# Patient Record
Sex: Female | Born: 1985 | Race: Black or African American | Hispanic: No | Marital: Single | Smoking: Never smoker
Health system: Southern US, Community
[De-identification: ages and names within clinical notes are randomized; demographics above are authoritative.]

## PROBLEM LIST (undated history)

## (undated) DIAGNOSIS — IMO0002 Reserved for concepts with insufficient information to code with codable children: Secondary | ICD-10-CM

## (undated) DIAGNOSIS — Z789 Other specified health status: Secondary | ICD-10-CM

## (undated) DIAGNOSIS — J189 Pneumonia, unspecified organism: Secondary | ICD-10-CM

## (undated) DIAGNOSIS — R87619 Unspecified abnormal cytological findings in specimens from cervix uteri: Secondary | ICD-10-CM

## (undated) HISTORY — DX: Reserved for concepts with insufficient information to code with codable children: IMO0002

## (undated) HISTORY — PX: LEEP: SHX91

## (undated) HISTORY — DX: Unspecified abnormal cytological findings in specimens from cervix uteri: R87.619

---
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
## 2009-05-01 ENCOUNTER — Other Ambulatory Visit: Admission: RE | Admit: 2009-05-01 | Discharge: 2009-05-01 | Payer: Self-pay | Admitting: Unknown Physician Specialty

## 2009-05-01 ENCOUNTER — Encounter (INDEPENDENT_AMBULATORY_CARE_PROVIDER_SITE_OTHER): Payer: Self-pay | Admitting: Unknown Physician Specialty

## 2009-05-07 ENCOUNTER — Emergency Department (HOSPITAL_COMMUNITY): Admission: EM | Admit: 2009-05-07 | Discharge: 2009-05-08 | Payer: Self-pay | Admitting: Emergency Medicine

## 2009-12-11 ENCOUNTER — Other Ambulatory Visit: Admission: RE | Admit: 2009-12-11 | Discharge: 2009-12-11 | Payer: Self-pay | Admitting: Unknown Physician Specialty

## 2010-07-21 NOTE — L&D Delivery Note (Addendum)
Delivery Note At 9:14 AM a viable female was delivered via Vaginal, Spontaneous Delivery (Presentation: Left Occiput Anterior).  APGAR: 4, 7; weight 7 lb 10.6 oz (3475 g).   Placenta status: Intact, Spontaneous.  Cord: 3 vessels with the following complications: 1 minute 15 second shoulder dystocia.  Delivery of the head occurred at 9:13am.  During the first 30 seconds, downward traction was preformed with McRoberts and suprapubic pressure, without success.  A shoulder dystocia code was called.  At 9:13:30, suprapubic pressure was continued as I attempted to feel for the posterior shoulder.  Despite multiple attempts, I was unable to reduce the posterior shoulder.  Dr Marice Potter arrived at 9:14:10 and resolved the shoulder dystocia by 9:14:15.      Anesthesia: Epidural  Episiotomy: none Lacerations: 2nd degree;Perineal Suture Repair: 3.0 vicryl Est. Blood Loss (mL): 500  Mom to postpartum.  Baby to NICU.  Elizabeth Horton JEHIEL 06/19/2011, 9:52 AM

## 2010-10-24 LAB — URINALYSIS, ROUTINE W REFLEX MICROSCOPIC
Bilirubin Urine: NEGATIVE
Glucose, UA: NEGATIVE mg/dL
Hgb urine dipstick: NEGATIVE
Ketones, ur: NEGATIVE mg/dL
Nitrite: NEGATIVE
Protein, ur: NEGATIVE mg/dL
Specific Gravity, Urine: <1.005 — ABNORMAL LOW (ref 1.005–1.030)
Urobilinogen, UA: 0.2 mg/dL (ref 0.0–1.0)
pH: 6.5 (ref 5.0–8.0)

## 2010-10-24 LAB — URINE MICROSCOPIC-ADD ON

## 2010-10-24 LAB — PREGNANCY, URINE: Preg Test, Ur: NEGATIVE

## 2010-11-14 LAB — HEPATITIS B SURFACE ANTIGEN: Hepatitis B Surface Ag: NEGATIVE

## 2010-11-14 LAB — HIV ANTIBODY (ROUTINE TESTING W REFLEX): HIV: NONREACTIVE

## 2010-11-14 LAB — RPR: RPR: NONREACTIVE

## 2010-11-14 LAB — RUBELLA ANTIBODY, IGM: Rubella: IMMUNE

## 2010-11-14 LAB — ABO/RH: RH Type: POSITIVE

## 2011-05-27 LAB — STREP B DNA PROBE: GBS: NEGATIVE

## 2011-06-18 ENCOUNTER — Inpatient Hospital Stay (HOSPITAL_COMMUNITY)
Admission: AD | Admit: 2011-06-18 | Discharge: 2011-06-21 | DRG: 774 | Disposition: A | Discharge status: Home / Self Care | Payer: Medicaid Other | Source: Ambulatory Visit | Attending: Family Medicine | Admitting: Family Medicine

## 2011-06-18 ENCOUNTER — Inpatient Hospital Stay (HOSPITAL_COMMUNITY): Payer: Medicaid Other | Admitting: Anesthesiology

## 2011-06-18 ENCOUNTER — Encounter (HOSPITAL_COMMUNITY): Payer: Self-pay | Admitting: Anesthesiology

## 2011-06-18 ENCOUNTER — Encounter (HOSPITAL_COMMUNITY): Payer: Self-pay | Admitting: *Deleted

## 2011-06-18 DIAGNOSIS — IMO0001 Reserved for inherently not codable concepts without codable children: Secondary | ICD-10-CM

## 2011-06-18 DIAGNOSIS — O411090 Infection of amniotic sac and membranes, unspecified, unspecified trimester, not applicable or unspecified: Secondary | ICD-10-CM | POA: Diagnosis present

## 2011-06-18 DIAGNOSIS — Z3490 Encounter for supervision of normal pregnancy, unspecified, unspecified trimester: Secondary | ICD-10-CM

## 2011-06-18 HISTORY — DX: Other specified health status: Z78.9

## 2011-06-18 LAB — CBC
HCT: 34.4 % — ABNORMAL LOW (ref 36.0–46.0)
Hemoglobin: 11.4 g/dL — ABNORMAL LOW (ref 12.0–15.0)
MCH: 26.6 pg (ref 26.0–34.0)
MCHC: 33.1 g/dL (ref 30.0–36.0)
MCV: 80.4 fL (ref 78.0–100.0)
Platelets: 260 10*3/uL (ref 150–400)
RBC: 4.28 MIL/uL (ref 3.87–5.11)
RDW: 17.1 % — ABNORMAL HIGH (ref 11.5–15.5)
WBC: 10.4 10*3/uL (ref 4.0–10.5)

## 2011-06-18 LAB — RPR: RPR Ser Ql: NONREACTIVE

## 2011-06-18 MED ORDER — OXYCODONE-ACETAMINOPHEN 5-325 MG PO TABS: 2.0000 | ORAL_TABLET | ORAL | Status: DC | PRN

## 2011-06-18 MED ORDER — PHENYLEPHRINE 40 MCG/ML (10ML) SYRINGE FOR IV PUSH (FOR BLOOD PRESSURE SUPPORT): 80.0000 ug | PREFILLED_SYRINGE | INTRAVENOUS | Status: DC | PRN

## 2011-06-18 MED ORDER — OXYTOCIN BOLUS FROM INFUSION
500.0000 mL | Freq: Once | INTRAVENOUS | Status: DC
  Filled 2011-06-18: qty 500

## 2011-06-18 MED ORDER — ACETAMINOPHEN 325 MG PO TABS
650.0000 mg | ORAL_TABLET | ORAL | Status: DC | PRN
  Administered 2011-06-18 – 2011-06-19 (×3): 650 mg via ORAL
  Filled 2011-06-18: qty 1
  Filled 2011-06-18 (×3): qty 2

## 2011-06-18 MED ORDER — EPHEDRINE 5 MG/ML INJ
10.0000 mg | INTRAVENOUS | Status: DC | PRN
  Filled 2011-06-18: qty 4

## 2011-06-18 MED ORDER — IBUPROFEN 600 MG PO TABS: 600.0000 mg | ORAL_TABLET | Freq: Four times a day (QID) | ORAL | Status: DC | PRN

## 2011-06-18 MED ORDER — EPHEDRINE 5 MG/ML INJ: 10.0000 mg | INTRAVENOUS | Status: DC | PRN

## 2011-06-18 MED ORDER — ONDANSETRON HCL 4 MG/2ML IJ SOLN: 4.0000 mg | Freq: Four times a day (QID) | INTRAMUSCULAR | Status: DC | PRN

## 2011-06-18 MED ORDER — SODIUM CHLORIDE 0.9 % IV SOLN
2.0000 g | Freq: Four times a day (QID) | INTRAVENOUS | Status: DC
  Administered 2011-06-18 – 2011-06-20 (×5): 2 g via INTRAVENOUS
  Filled 2011-06-18 (×8): qty 2000

## 2011-06-18 MED ORDER — CITRIC ACID-SODIUM CITRATE 334-500 MG/5ML PO SOLN
30.0000 mL | ORAL | Status: DC | PRN
  Filled 2011-06-18: qty 15

## 2011-06-18 MED ORDER — LIDOCAINE HCL 1.5 % IJ SOLN
INTRAMUSCULAR | Status: DC | PRN
  Administered 2011-06-18 (×2): 5 mL via EPIDURAL

## 2011-06-18 MED ORDER — DIPHENHYDRAMINE HCL 50 MG/ML IJ SOLN
12.5000 mg | INTRAMUSCULAR | Status: DC | PRN
  Administered 2011-06-18: 12.5 mg via INTRAVENOUS
  Filled 2011-06-18: qty 1

## 2011-06-18 MED ORDER — FENTANYL 2.5 MCG/ML BUPIVACAINE 1/10 % EPIDURAL INFUSION (WH - ANES)
INTRAMUSCULAR | Status: DC | PRN
  Administered 2011-06-18: 14 mL/h via EPIDURAL

## 2011-06-18 MED ORDER — PHENYLEPHRINE 40 MCG/ML (10ML) SYRINGE FOR IV PUSH (FOR BLOOD PRESSURE SUPPORT)
80.0000 ug | PREFILLED_SYRINGE | INTRAVENOUS | Status: DC | PRN
  Filled 2011-06-18: qty 5

## 2011-06-18 MED ORDER — GENTAMICIN SULFATE 40 MG/ML IJ SOLN
170.0000 mg | Freq: Three times a day (TID) | INTRAVENOUS | Status: DC
  Administered 2011-06-18 – 2011-06-19 (×3): 170 mg via INTRAVENOUS
  Filled 2011-06-18 (×6): qty 4.25

## 2011-06-18 MED ORDER — LACTATED RINGERS IV SOLN
500.0000 mL | INTRAVENOUS | Status: DC | PRN
  Administered 2011-06-18: 500 mL via INTRAVENOUS

## 2011-06-18 MED ORDER — LACTATED RINGERS IV SOLN
INTRAVENOUS | Status: DC
  Administered 2011-06-18 – 2011-06-19 (×4): via INTRAVENOUS

## 2011-06-18 MED ORDER — LACTATED RINGERS IV SOLN: 500.0000 mL | Freq: Once | INTRAVENOUS | Status: DC

## 2011-06-18 MED ORDER — LIDOCAINE HCL (PF) 1 % IJ SOLN
30.0000 mL | INTRAMUSCULAR | Status: DC | PRN
  Filled 2011-06-18: qty 30

## 2011-06-18 MED ORDER — FENTANYL 2.5 MCG/ML BUPIVACAINE 1/10 % EPIDURAL INFUSION (WH - ANES)
14.0000 mL/h | INTRAMUSCULAR | Status: DC
  Administered 2011-06-18 – 2011-06-19 (×5): 14 mL/h via EPIDURAL
  Filled 2011-06-18 (×6): qty 60

## 2011-06-18 MED ORDER — OXYTOCIN 20 UNITS IN LACTATED RINGERS INFUSION - SIMPLE
125.0000 mL/h | Freq: Once | INTRAVENOUS | Status: DC
  Filled 2011-06-18: qty 1000

## 2011-06-18 MED ORDER — FLEET ENEMA 7-19 GM/118ML RE ENEM: 1.0000 | ENEMA | RECTAL | Status: DC | PRN

## 2011-06-18 MED ORDER — ONDANSETRON 8 MG PO TBDP
8.0000 mg | ORAL_TABLET | Freq: Once | ORAL | Status: AC
  Administered 2011-06-18: 8 mg via ORAL
  Filled 2011-06-18: qty 1

## 2011-06-18 NOTE — H&P (Signed)
Elizabeth Horton is a 25 y.o. female presenting for SOL  Maternal Medical History:  Reason for admission: Reason for admission: contractions and nausea.  Contractions: Onset was 13-24 hours ago.   Frequency: regular.   Duration is approximately 3 minutes.   Perceived severity is strong.    Fetal activity: Perceived fetal activity is normal.   Last perceived fetal movement was within the past hour.    Prenatal complications: no prenatal complications Regular PNC at [facility]. Plans circ, breast, OCPs    OB History    Grav Para Term Preterm Abortions TAB SAB Ect Mult Living   1              Past Medical History  Diagnosis Date  . No pertinent past medical history    Past Surgical History  Procedure Date  . No past surgeries    Family History: family history is not on file.M- HTN, F-colon Ca, MGM-DM Social History:  reports that she has never smoked. She does not have any smokeless tobacco history on file. She reports that she does not drink alcohol or use illicit drugs.Lives with her mother, works [employer]  Review of Systems  Constitutional: Negative.  Negative for fever and chills.  Eyes: Negative for blurred vision.  Respiratory: Negative.   Gastrointestinal: Positive for nausea and vomiting. Negative for constipation.  Genitourinary: Negative.   Skin: Negative for rash.  Neurological: Negative.  Negative for headaches.  Psychiatric/Behavioral: Negative.     Dilation: 4 Effacement (%): 80 Station: -2 Exam by:: D. Leith Szafranski CNM Blood pressure 112/69, pulse 119, temperature 97.7 F (36.5 C), temperature source Oral, resp. rate 22, height 5\' 3"  (1.6 m), weight 87.816 kg (193 lb 9.6 oz). Maternal Exam:  Uterine Assessment: Contraction strength is moderate.  Contraction duration is 1 minute. Contraction frequency is regular.   Abdomen: Fundal height is term size.   Estimated fetal weight is 7#.   Fetal presentation: vertex  Introitus: Normal vulva. Normal vagina.  Vagina is  negative for discharge.    Fetal Exam Fetal Monitor Review: Mode: ultrasound.   Baseline rate: 130.  Variability: moderate (6-25 bpm).   Pattern: accelerations present and no decelerations.    Fetal State Assessment: Category I - tracings are normal.     Physical Exam  Constitutional: She is oriented to person, place, and time. She appears well-developed and well-nourished. No distress.  HENT:  Head: Normocephalic.  Neck: Neck supple.  Cardiovascular: Normal rate, regular rhythm and normal heart sounds.   Respiratory: Effort normal and breath sounds normal.  GI: Soft. There is no tenderness.  Genitourinary: Vagina normal. No vaginal discharge found.  Neurological: She is alert and oriented to person, place, and time. She has normal reflexes.  Skin: Skin is warm and dry.  Psychiatric: She has a normal mood and affect. Her behavior is normal.    Prenatal labs: ABO, Rh:  B+ Antibody:  neg Rubella:  Im RPR:   NR HBsAg:   neg HIV:   NR GBS:   neg 2hr GTT 77-110-100   Assessment/Plan: 25yo G1 at [redacted]w[redacted]d BEGA by 1st tri [place] in early active phase of labor with reassuring FHR Admit     Bowman Higbie 06/18/2011, 10:08 AM

## 2011-06-18 NOTE — ED Provider Notes (Signed)
25 yo G1 at 39.5 wks with SOL and reassuring FHR. Cx 4/80/-2 See Admission H&P Elizabeth Horton 06/18/2011 10:00 AM

## 2011-06-18 NOTE — Progress Notes (Signed)
uc's since last night, becoming more intense, denies bleeding or LOF.  SVE on Monday @ MD office, 2/50.

## 2011-06-18 NOTE — Anesthesia Procedure Notes (Signed)
Epidural Patient location during procedure: OB Start time: 06/18/2011 11:59 AM End time: 06/18/2011 12:06 PM Reason for block: procedure for pain  Staffing Anesthesiologist: Sandrea Hughs Performed by: anesthesiologist   Preanesthetic Checklist Completed: patient identified, site marked, surgical consent, pre-op evaluation, timeout performed, IV checked, risks and benefits discussed and monitors and equipment checked  Epidural Patient position: sitting Prep: site prepped and draped and DuraPrep Patient monitoring: continuous pulse ox and blood pressure Approach: midline Injection technique: LOR air  Needle:  Needle type: Tuohy  Needle gauge: 17 G Needle length: 9 cm Needle insertion depth: 6 cm Catheter type: closed end flexible Catheter size: 19 Gauge Catheter at skin depth: 11 cm Test dose: negative and 1.5% lidocaine  Assessment Sensory level: T8 Events: blood not aspirated, injection not painful, no injection resistance, negative IV test and no paresthesia

## 2011-06-18 NOTE — Progress Notes (Signed)
Elizabeth Horton is a 25 y.o. G1P0000 at [redacted]w[redacted]d  admitted for active labor  Subjective: Patient continues to feel some pressure and it is slightly increasing. No distress.   Objective: BP 106/60  Pulse 102  Temp(Src) 99.7 F (37.6 C) (Oral)  Resp 20  Ht 5\' 3"  (1.6 m)  Wt 87.816 kg (193 lb 9.6 oz)  BMI 34.29 kg/m2  SpO2 96%      FHT:  FHR: 150 bpm, variability: moderate,  accelerations:  Present,  decelerations:  Present patient noted to have 1 late decel at 19:54 after a change in baseline from 150 to 140 over several minutes. Baseline returned to 150 and accels seen. Patient then had 2 late decels from 20:16. Patient placed on o2 and repositioned. Patient had had some diminished variability during that time closer to 5-10 range which has now improved to >10 consistently.   UC:   regular, every 3-4 minutes SVE:   Dilation: 9 Effacement (%): 100 Station: -2 Exam by:: Jarome Lamas RNC  Labs: Lab Results  Component Value Date   WBC 10.4 06/18/2011   HGB 11.4* 06/18/2011   HCT 34.4* 06/18/2011   MCV 80.4 06/18/2011   PLT 260 06/18/2011    Assessment / Plan: Spontaneous labor, progressing normally Will recheck cervix within next half hour.   Labor: Progressing normally Fetal Wellbeing:  Category II Pain Control:  Epidural I/D:  n/a Anticipated MOD:  NSVD  [name] 06/18/2011, 8:36 PM

## 2011-06-18 NOTE — Progress Notes (Signed)
Pt reports she had fever of 100.6 last night, took 2 tylenol, afebrile today.

## 2011-06-18 NOTE — Progress Notes (Signed)
Alissia Lory Abdul-Rashid is a 25 y.o. G1P0000 at [redacted]w[redacted]d  admitted for SOL.   Subjective: Pt is resting comfortably.  Family at bedside. Feeling some mild pressure on right side during contractions.   Objective: BP 94/58  Pulse 107  Temp(Src) 98.1 F (36.7 C) (Oral)  Resp 20  Ht 5\' 3"  (1.6 m)  Wt 87.816 kg (193 lb 9.6 oz)  BMI 34.29 kg/m2  SpO2 96%      FHT:  FHR: 130 bpm, variability: moderate,  accelerations:  Abscent,  decelerations:  Absent UC:   irregular, every 3-5 minutes SVE:   Dilation: 6 Effacement (%): 100 Station: -2 Exam by:: Erline Hau RNC  Labs: Lab Results  Component Value Date   WBC 10.4 06/18/2011   HGB 11.4* 06/18/2011   HCT 34.4* 06/18/2011   MCV 80.4 06/18/2011   PLT 260 06/18/2011    Assessment / Plan: Augmentation of labor, progressing well   Fetal Wellbeing:  Category I Pain Control:  Epidural  Anticipated MOD:  NSVD  O'Grady, Margherita Collyer 06/18/2011, 1:28 PM

## 2011-06-18 NOTE — Progress Notes (Signed)
ANTIBIOTIC CONSULT NOTE - INITIAL  Pharmacy Consult for Gentamicin Indication: Chorioamnionitis  No Known Allergies  Patient Measurements: Height: 5\' 3"  (160 cm) Weight: 193 lb 9.6 oz (87.816 kg) IBW/kg (Calculated) : 52.4  Adjusted Body Weight: 63kg  Vital Signs: Temp: 100.9 F (38.3 C) (11/28 2323) Temp src: Oral (11/28 2323) BP: 115/60 mmHg (11/28 2302) Pulse Rate: 112  (11/28 2302)  Labs:  Basename 06/18/11 1010  WBC 10.4  HGB 11.4*  PLT 260  LABCREA --  CREATININE --   Estimated Scr=0.7 with estimated CrCl > 146ml/min.  Medical History: Past Medical History  Diagnosis Date  . No pertinent past medical history     Medications:  Ampicillin 2 gram IV q6h Assessment: 25yo admitted in labor with maternal temp > 100F.  Goal of Therapy:  Gentamicin peaks 6-50mcg/ml and trough < 1 mcg/ml.  Plan:  1. Gentamicin 170mg  IV q8h. 2. Draw Screatinine if continued postpartum. 3. Will continue to follow and check levels as clinically indicated.  Thanks!  Claybon Jabs 06/18/2011,11:24 PM

## 2011-06-18 NOTE — H&P (Signed)
Chart reviewed and agree with management and plan.  

## 2011-06-18 NOTE — Progress Notes (Signed)
Elizabeth Horton is a 25 y.o. G1P0000 at [redacted]w[redacted]d by admitted for active labor.   Subjective: Pt is feeling comfortable.  Family at bedside. Pt had some itching, and so rcvd benadryl.   Objective: BP 120/64  Pulse 103  Temp(Src) 98.2 F (36.8 C) (Oral)  Resp 20  Ht 5\' 3"  (1.6 m)  Wt 87.816 kg (193 lb 9.6 oz)  BMI 34.29 kg/m2  SpO2 96%      FHT:  FHR: 135 bpm, variability: moderate,  accelerations:  Present,  decelerations:  Absent UC:   regular, every 4 minutes SVE:   Dilation 8  Effacement 100  Station: -2  Labs: Lab Results  Component Value Date   WBC 10.4 06/18/2011   HGB 11.4* 06/18/2011   HCT 34.4* 06/18/2011   MCV 80.4 06/18/2011   PLT 260 06/18/2011    Assessment / Plan: Augmentation of labor, progressing well  Labor: Progressing normally on pit.  Will cont with expectant management.   Fetal Wellbeing:  Category I Pain Control:  Epidural Anticipated MOD:  NSVD  O'Grady, Tee Richeson 06/18/2011, 4:34 PM

## 2011-06-18 NOTE — Anesthesia Preprocedure Evaluation (Signed)
Anesthesia Evaluation  Patient identified by MRN, date of birth, ID band Patient awake    Reviewed: Allergy & Precautions, H&P , Patient's Chart, lab work & pertinent test results  Airway Mallampati: I TM Distance: >3 FB Neck ROM: full    Dental No notable dental hx.    Pulmonary neg pulmonary ROS,    Pulmonary exam normal       Cardiovascular neg cardio ROS     Neuro/Psych Negative Neurological ROS  Negative Psych ROS   GI/Hepatic negative GI ROS, Neg liver ROS,   Endo/Other  Negative Endocrine ROS  Renal/GU negative Renal ROS  Genitourinary negative   Musculoskeletal negative musculoskeletal ROS (+)   Abdominal Normal abdominal exam  (+)   Peds negative pediatric ROS (+)  Hematology negative hematology ROS (+)   Anesthesia Other Findings   Reproductive/Obstetrics (+) Pregnancy                           Anesthesia Physical Anesthesia Plan  ASA: II  Anesthesia Plan: Epidural   Post-op Pain Management:    Induction:   Airway Management Planned:   Additional Equipment:   Intra-op Plan:   Post-operative Plan:   Informed Consent: I have reviewed the patients History and Physical, chart, labs and discussed the procedure including the risks, benefits and alternatives for the proposed anesthesia with the patient or authorized representative who has indicated his/her understanding and acceptance.     Plan Discussed with:   Anesthesia Plan Comments:         Anesthesia Quick Evaluation  

## 2011-06-18 NOTE — Progress Notes (Signed)
Elizabeth Horton is a 25 y.o. G1P0000 at [redacted]w[redacted]d  admitted for SOL.   Subjective:   Objective: BP 97/79  Pulse 117  Temp(Src) 98.2 F (36.8 C) (Oral)  Resp 20  Ht 5\' 3"  (1.6 m)  Wt 87.816 kg (193 lb 9.6 oz)  BMI 34.29 kg/m2  SpO2 96%      FHT:  FHR: 130 bpm, variability: moderate,  accelerations:  Present,  decelerations:  Absent UC:   2-3 ctx in 10 minutes SVE:   Dilation: 7 Effacement (%): 100 Station: -2 Exam by:: Erline Hau RNC  Labs: Lab Results  Component Value Date   WBC 10.4 06/18/2011   HGB 11.4* 06/18/2011   HCT 34.4* 06/18/2011   MCV 80.4 06/18/2011   PLT 260 06/18/2011    Assessment / Plan: Augmentation of labor, progressing well  Labor: Progressing on Pitocin, will continue to increase then AROM Fetal Wellbeing:  Category I Pain Control:  Epidural  Anticipated MOD:  NSVD  O'Grady, Riko Lumsden 06/18/2011, 3:25 PM

## 2011-06-18 NOTE — Progress Notes (Signed)
   Elizabeth Horton is a 25 y.o. G1P0000 at [redacted]w[redacted]d  admitted for active labor  Subjective:  cmfortable with epidural Objective: BP 102/59  Pulse 86  Temp(Src) 99.7 F (37.6 C) (Oral)  Resp 20  Ht 5\' 3"  (1.6 m)  Wt 87.816 kg (193 lb 9.6 oz)  BMI 34.29 kg/m2  SpO2 96%    FHT:  140's, avg LTV, + accels, no decels UC:   regular, every 3 minutes SVE:   Dilation: 9 Effacement (%): 100 Station: -2 Exam by:: [employer] RNC  Labs: Lab Results  Component Value Date   WBC 10.4 06/18/2011   HGB 11.4* 06/18/2011   HCT 34.4* 06/18/2011   MCV 80.4 06/18/2011   PLT 260 06/18/2011    Assessment / Plan: Spontaneous labor, progressing normally  Labor: Progressing normally Fetal Wellbeing:  Category I Pain Control:  Epidural Anticipated MOD:  NSVD  CRESENZO-DISHMAN,Shanayah Kaffenberger 06/18/2011, 8:19 PM

## 2011-06-18 NOTE — ED Provider Notes (Signed)
Chart reviewed and agree with management and plan.  

## 2011-06-19 ENCOUNTER — Encounter (HOSPITAL_COMMUNITY): Payer: Self-pay | Admitting: *Deleted

## 2011-06-19 DIAGNOSIS — O411090 Infection of amniotic sac and membranes, unspecified, unspecified trimester, not applicable or unspecified: Secondary | ICD-10-CM

## 2011-06-19 MED ORDER — OXYCODONE-ACETAMINOPHEN 5-325 MG PO TABS: 1.0000 | ORAL_TABLET | ORAL | Status: DC | PRN

## 2011-06-19 MED ORDER — PRENATAL PLUS 27-1 MG PO TABS
1.0000 | ORAL_TABLET | Freq: Every day | ORAL | Status: DC
  Administered 2011-06-20 – 2011-06-21 (×2): 1 via ORAL
  Filled 2011-06-19 (×2): qty 1

## 2011-06-19 MED ORDER — DIPHENHYDRAMINE HCL 25 MG PO CAPS: 25.0000 mg | ORAL_CAPSULE | Freq: Four times a day (QID) | ORAL | Status: DC | PRN

## 2011-06-19 MED ORDER — OXYTOCIN 20 UNITS IN LACTATED RINGERS INFUSION - SIMPLE
1.0000 m[IU]/min | INTRAVENOUS | Status: DC
  Administered 2011-06-19: 2 m[IU]/min via INTRAVENOUS

## 2011-06-19 MED ORDER — ONDANSETRON HCL 4 MG PO TABS: 4.0000 mg | ORAL_TABLET | ORAL | Status: DC | PRN

## 2011-06-19 MED ORDER — TERBUTALINE SULFATE 1 MG/ML IJ SOLN: 0.2500 mg | Freq: Once | INTRAMUSCULAR | Status: DC | PRN

## 2011-06-19 MED ORDER — SIMETHICONE 80 MG PO CHEW: 80.0000 mg | CHEWABLE_TABLET | ORAL | Status: DC | PRN

## 2011-06-19 MED ORDER — DIBUCAINE 1 % RE OINT: 1.0000 "application " | TOPICAL_OINTMENT | RECTAL | Status: DC | PRN

## 2011-06-19 MED ORDER — ONDANSETRON HCL 4 MG/2ML IJ SOLN: 4.0000 mg | INTRAMUSCULAR | Status: DC | PRN

## 2011-06-19 MED ORDER — BENZOCAINE-MENTHOL 20-0.5 % EX AERO
1.0000 "application " | INHALATION_SPRAY | CUTANEOUS | Status: DC | PRN
  Administered 2011-06-19: 1 via TOPICAL

## 2011-06-19 MED ORDER — ZOLPIDEM TARTRATE 5 MG PO TABS
5.0000 mg | ORAL_TABLET | Freq: Every evening | ORAL | Status: DC | PRN
  Administered 2011-06-20: 5 mg via ORAL
  Filled 2011-06-19: qty 1

## 2011-06-19 MED ORDER — BENZOCAINE-MENTHOL 20-0.5 % EX AERO
INHALATION_SPRAY | CUTANEOUS | Status: AC
  Filled 2011-06-19: qty 56

## 2011-06-19 MED ORDER — SENNOSIDES-DOCUSATE SODIUM 8.6-50 MG PO TABS
2.0000 | ORAL_TABLET | Freq: Every day | ORAL | Status: DC
  Administered 2011-06-19 – 2011-06-20 (×2): 2 via ORAL

## 2011-06-19 MED ORDER — LANOLIN HYDROUS EX OINT: TOPICAL_OINTMENT | CUTANEOUS | Status: DC | PRN

## 2011-06-19 MED ORDER — WITCH HAZEL-GLYCERIN EX PADS: 1.0000 "application " | MEDICATED_PAD | CUTANEOUS | Status: DC | PRN

## 2011-06-19 MED ORDER — IBUPROFEN 600 MG PO TABS
600.0000 mg | ORAL_TABLET | Freq: Four times a day (QID) | ORAL | Status: DC
  Administered 2011-06-19 – 2011-06-21 (×9): 600 mg via ORAL
  Filled 2011-06-19 (×9): qty 1

## 2011-06-19 MED ORDER — TETANUS-DIPHTH-ACELL PERTUSSIS 5-2.5-18.5 LF-MCG/0.5 IM SUSP
0.5000 mL | Freq: Once | INTRAMUSCULAR | Status: AC
  Administered 2011-06-21: 0.5 mL via INTRAMUSCULAR
  Filled 2011-06-19: qty 0.5

## 2011-06-19 NOTE — Progress Notes (Signed)
UR Chart review completed.  

## 2011-06-19 NOTE — Anesthesia Postprocedure Evaluation (Signed)
  Anesthesia Post-op Note  Patient: Elizabeth Horton  Procedure(s) Performed: * No procedures listed *  Patient Location: [facility] Unit  Anesthesia Type: Epidural  Level of Consciousness: awake  Airway and Oxygen Therapy: Patient Spontanous Breathing  Post-op Pain: mild  Post-op Assessment: Post-op Vital signs reviewed  Post-op Vital Signs: Reviewed and stable  Complications: No apparent anesthesia complications

## 2011-06-19 NOTE — Addendum Note (Signed)
Addendum  created 06/19/11 1636 by Cephus Shelling   Modules edited:Charges VN, Notes Section

## 2011-06-19 NOTE — Progress Notes (Signed)
   Elizabeth Horton is a 25 y.o. G1P0000 at [redacted]w[redacted]d  admitted for active labor  Subjective:  Comfortable with epidural.  Motivated for vaginal delivery if possible Objective: BP 104/61  Pulse 94  Temp(Src) 100.3 F (37.9 C) (Axillary)  Resp 20  Ht 5\' 3"  (1.6 m)  Wt 87.816 kg (193 lb 9.6 oz)  BMI 34.29 kg/m2  SpO2 100%    FHT:  FHR: 150 bpm, variability: moderate,  accelerations:  Present,  decelerations:  Present late UC:   regular, every 2 minutes SVE:   Dilation: Lip/rim Effacement (%): 100 Station: -1 Exam by:: F Cresenzo-Dishmon CNM Contractions have been adequate (180-229mmHg) for the past hour.  Late decels for the past 20 minutes.  Vtx down to 0 station with contractions.  Pelvis feels adequate.  Dr. Despina Hidden consulted.  Discussed options with pt.  Will cut pitocin off, allow baby intrauterine resuscitation, and try pushing.  Labs: Lab Results  Component Value Date   WBC 10.4 06/18/2011   HGB 11.4* 06/18/2011   HCT 34.4* 06/18/2011   MCV 80.4 06/18/2011   PLT 260 06/18/2011    Assessment / Plan: Protracted active phase  Labor: adequate Fetal Wellbeing:  Category II Pain Control:  Epidural Anticipated MOD:  NSVD  CRESENZO-DISHMAN,Chisa Kushner 06/19/2011, 5:30 AM

## 2011-06-19 NOTE — Progress Notes (Signed)
Zenovia Justman Abdul-Rashid is a 25 y.o. G1P0000 at [redacted]w[redacted]d admitted for active labor  Subjective: Can feel pressure during contractions  Objective: BP 117/70  Pulse 90  Temp(Src) 99.7 F (37.6 C) (Axillary)  Resp 20  Ht 5\' 3"  (1.6 m)  Wt 87.816 kg (193 lb 9.6 oz)  BMI 34.29 kg/m2  SpO2 96%      FHT:  FHR: 150 bpm, variability: moderate,  accelerations:  Present,  decelerations:  Absent UC:   regular, every 4 minutes SVE:   Dilation: Lip/rim Effacement (%): 100 Station: -1 Exam by:: Larose Kells RN  Labs: Lab Results  Component Value Date   WBC 10.4 06/18/2011   HGB 11.4* 06/18/2011   HCT 34.4* 06/18/2011   MCV 80.4 06/18/2011   PLT 260 06/18/2011    Assessment / Plan: Spontaneous labor Patient did not progress with her cervix in 2 hours. Placed IUPC to monitor for adequate contractions. WIll start pitocin if not adequate. Otherwise, will give patient approximately 3 hours with 1 hour checks before discussions of possible C-section. FHT reassuring-will continue to monitor.   Labor: see plan Fetal Wellbeing:  Category I Pain Control:  Epidural I/D:  ampicillin and gentamicin for chorioamnionitis Anticipated MOD:  NSVD  Palin Tristan 06/19/2011, 12:57 AM

## 2011-06-19 NOTE — Anesthesia Postprocedure Evaluation (Signed)
Anesthesia Post Note  Patient: Elizabeth Horton  Procedure(s) Performed: * No procedures listed *  Anesthesia type: Epidural  Patient location: Mother/Baby  Post pain: Pain level controlled  Post assessment: Post-op Vital signs reviewed  Last Vitals:  Filed Vitals:   06/19/11 1017  BP: 116/70  Pulse: 88  Temp:   Resp:     Post vital signs: Reviewed  Level of consciousness: awake  Complications: No apparent anesthesia complications

## 2011-06-19 NOTE — Progress Notes (Signed)
   Elizabeth Horton is a 25 y.o. G1P0000 at [redacted]w[redacted]d  admitted for active labor  Subjective:   Objective: BP 115/67  Pulse 96  Temp(Src) 100.9 F (38.3 C) (Oral)  Resp 20  Ht 5\' 3"  (1.6 m)  Wt 87.816 kg (193 lb 9.6 oz)  BMI 34.29 kg/m2  SpO2 96%    FHT:  FHR: 160. bpm, variability: moderate,  accelerations:  Present,  decelerations:  Absent UC:   regular, every 2-3 minutes SVE:   Dilation: Lip/rim Effacement (%): 100 Station: -1 Exam by:: Elizabeth Kells RN  Labs: Lab Results  Component Value Date   WBC 10.4 06/18/2011   HGB 11.4* 06/18/2011   HCT 34.4* 06/18/2011   MCV 80.4 06/18/2011   PLT 260 06/18/2011    Assessment / Plan: Augmentation of labor, progressing well; chorioamnionitis  Labor: Progressing normally Fetal Wellbeing:  Category II Pain Control:  Epidural Anticipated MOD:  NSVD  Elizabeth Horton 06/19/2011, 12:05 AM

## 2011-06-19 NOTE — Addendum Note (Signed)
Addendum  created 06/19/11 1636 by Marshell Dilauro   Modules edited:Charges VN, Notes Section    

## 2011-06-19 NOTE — Progress Notes (Signed)
Patient ID: Elizabeth Horton, female   DOB: [birth year], 25 y.o.   MRN: [id number]   I was heading towards the room of Elizabeth Horton when I saw the emergency light turn on.  I immediately ran into the room and was informed by Dr. Adrian Blackwater that he needed my assistance with delivering the baby.  Suprapubic pressure was being given and she was already in McRobert's position.  I immediately did a corkscrew maneuver and the baby rotated from OA to the LOA position and was delivered within 10 seconds. Dr. Adrian Blackwater then resumed her care and delivered the placenta.

## 2011-06-19 NOTE — Progress Notes (Signed)
Patient ID: Elizabeth Horton, female   DOB: Jul 27, 1985, 25 y.o.   MRN: [id number] Patient reassessed.  Since 0500 the heart rate has benn reassuring with positive scalp stim and the disappearance immediately of late decelerations when the pitocin was turned off and the contractions were less frequent.  Patient began pushing about 1 hour ago.  Molding now down to +3 station, feels as if the baby will fit through.  Patient pushing well.  Again positive scalp stim, no concern at this point for any fetal compromise.  Being treated for chorioamnionitis but metabolically I think baby is stable.  Female [name] 06/19/2011 7:23 AM

## 2011-06-19 NOTE — Progress Notes (Signed)
Elizabeth Horton is a 25 y.o. G1P0000 at 21w6dadmitted for active labor  Subjective: Attempting to rest. Still feeling pressure during contractions but pain controlled  Objective: BP 111/59  Pulse 93  Temp(Src) 100.9 F (38.3 C) (Axillary)  Resp 20  Ht 5\' 3"  (1.6 m)  Wt 87.816 kg (193 lb 9.6 oz)  BMI 34.29 kg/m2  SpO2 100%      FHT:  FHR: 150 bpm, variability: moderate,  accelerations:  Present,  decelerations:  Present patient had 1 4 minute decel when pitocin was turned up to 4, resolved when turned back down to 2 and with repositioning. Since then has had occasional variable decel. At present, no decels.  UC:   regular, every 2-4 minutes, will intermittently have breaks for 6 minutes. MVU approximately 150-190 per 10 minutes averaged over 30 minutes.  SVE:   Dilation: Lip/rim Effacement (%): 100 Station: -1 Exam by:: Larose Kells RN  Labs: Lab Results  Component Value Date   WBC 10.4 06/18/2011   HGB 11.4* 06/18/2011   HCT 34.4* 06/18/2011   MCV 80.4 06/18/2011   PLT 260 06/18/2011    Assessment / Plan: Augmentation of labor with pitocin, MVU intermittently adquate.  Will monitor over course of 2 more checks then begin c-section discussion. Will consider waiting longer as long as baby still appearing healthy on monitor.   Labor: on pitocin Fetal Wellbeing:  Category II Pain Control:  Epidural I/D:  amp/gent for chorioamnionitis Anticipated MOD:  NSVD  Tana Conch, MD 06/19/2011, 3:24 AM

## 2011-06-19 NOTE — Progress Notes (Signed)
MVU approximately 70-120 in several 10 minute periods. Due to inadequate contractions, will augment with Pitocin.

## 2011-06-20 LAB — CREATININE, SERUM
Creatinine, Ser: 1.11 mg/dL — ABNORMAL HIGH (ref 0.50–1.10)
GFR calc Af Amer: 79 mL/min — ABNORMAL LOW (ref >90)
GFR calc non Af Amer: 68 mL/min — ABNORMAL LOW (ref >90)

## 2011-06-20 NOTE — Progress Notes (Signed)
PSYCHOSOCIAL ASSESSMENT ~ MATERNAL/CHILD Name: Elizabeth Horton                                                                                                           Age: 25   Referral Date: 06/20/11   Reason/Source: NICU support/NICU  I. FAMILY/HOME ENVIRONMENT A. Child's Legal Guardian __x_Parent(s) ___Grandparent ___Foster parent ___DSS_________________ Name: Elizabeth Horton                           DOB: December 30, 1985          Age: 37  Address:  Name:                                                               DOB: //                     Age:   Address:  B. Other Household Members/Support Persons Name:                                         Relationship:                        DOB ___/___/___                   Name:                                         Relationship:                        DOB ___/___/___                   Name:                                         Relationship:                        DOB ___/___/___                   Name:                                         Relationship:                        DOB ___/___/___  C. Other Support: Good support system   II. PSYCHOSOCIAL DATA A. Information Source                                                                                             _x_Patient Interview  __Family Interview           _x_Other: Elizabeth Horton  B. Event organiser __Employment: _x_Medicaid    [place]: [employer]                __Private Insurance:                   __Self Pay  __Food Stamps   __WIC __[employer]     __Public Housing     __Section 8    __Maternity Care Coordination/Child Service Coordination/Early Intervention  _x_School: FOB is in college                                                                        Grade:  __Other:   Elizabeth Horton and Environment Information Cultural Issues Impacting Care: none known  III. STRENGTHS __x_Supportive family/friends __x_Adequate Resources __x_Compliance with medical  plan __x_Home prepared for Child (including basic supplies) __x_Understanding of illness      __x_Other: Pediatric follow up will be at [facility]. IV. RISK FACTORS AND CURRENT PROBLEMS         __x__No Problems Noted                                                                                                                                                                                                                                       Pt              Family     Substance Abuse  ___              ___        Mental Illness                                                                        ___              ___  Family/Relationship Issues                                      ___               ___             Abuse/Neglect/Domestic Violence                                         ___         ___  Financial Resources                                        ___              ___             Transportation                                                                        ___               ___  DSS Involvement                                                                   ___              ___  Adjustment to Illness                                                               ___              ___  Knowledge/Cognitive Deficit  ___              ___             Compliance with Treatment                                                 ___              ___  Basic Needs (food, housing, etc.)                                          ___              ___             Housing Concerns                                       ___              ___ Other_____________________________________________________________            V. SOCIAL WORK ASSESSMENT SW met with MOB and PGM in Elizabeth third floor room to introduce myself, complete assessment and evaluate how they are coping with baby's admission to  NICU.  Family was very friendly.  MOB reports good supports and seems to have a good understanding of baby's situation and reason for admission.  She states she has everything she needs for baby at home and seems excited about becoming a mother.  She reports no issues with transportation if she is discharged before baby.  FOB and his family are involved and supportive.  PGM and MOM had questions about outpatient vs inpatient circumcision.  SW explained that baby's can only have it done on an outpatient basis until 49 weeks of age so to call Elizabeth doctor to see if he can schedule it before baby leaves the hospital because he will not be able to come back to the hospital to have it done.  Family showed understanding.  They state no further questions or needs at this time.  SW explained support services offered by NICU SWs and gave contact information.  VI. SOCIAL WORK PLAN  ___No Further Intervention Required/No Barriers to Discharge   _x__Psychosocial Support and Ongoing Assessment of Needs   ___Patient/Family Education:   ___Child Protective Services Report   County___________ Date___/____/____   ___Information/Referral to [employer]_________________________   ___Other:

## 2011-06-20 NOTE — Progress Notes (Signed)
I have seen/examined this patient and agree with the student's assessment and plan. Tarae Wooden E.

## 2011-06-20 NOTE — Progress Notes (Signed)
Post Partum Day 1 Subjective: no complaints, up ad lib, voiding and tolerating PO. No bowel movements yet. Patient reports mild cramping in her stomach. Denies pain in abdomen or pelvic areas and lightheadedness/dizziness. Mild soreness in pelvic areas.  Objective: Blood pressure 94/60, pulse 73, temperature 97.9 F (36.6 C), temperature source Oral, resp. rate 18, height 5\' 3"  (1.6 m), weight 87.544 kg (193 lb), SpO2 100.00%, unknown if currently breastfeeding.  Physical Exam:  General: alert, cooperative and no distress Heart: Regular rate and rhythm. No murmurs, rubs, gallops. Lungs: clear to auscultation bilaterally. No wheezes, rhonchi, crackles. Abdomen: Soft, nontender. Active bowel sounds. Lochia: appropriate Uterine Fundus: firm DVT Evaluation: No cords or calf tenderness.   Basename 06/18/11 1010  HGB 11.4*  HCT 34.4*    Assessment/Plan: Plan for discharge tomorrow, Breastfeeding and Contraception Micronor. Patient has no complaints.    LOS: 2 days   Mosetta Putt, PA-S 06/20/2011, 7:29 AM

## 2011-06-21 DIAGNOSIS — Z3490 Encounter for supervision of normal pregnancy, unspecified, unspecified trimester: Secondary | ICD-10-CM

## 2011-06-21 LAB — COMPREHENSIVE METABOLIC PANEL
ALT: 17 U/L (ref 0–35)
AST: 32 U/L (ref 0–37)
Albumin: 2 g/dL — ABNORMAL LOW (ref 3.5–5.2)
Alkaline Phosphatase: 113 U/L (ref 39–117)
BUN: 9 mg/dL (ref 6–23)
CO2: 21 mEq/L (ref 19–32)
Calcium: 9.6 mg/dL (ref 8.4–10.5)
Chloride: 106 mEq/L (ref 96–112)
Creatinine, Ser: 0.97 mg/dL (ref 0.50–1.10)
GFR calc Af Amer: >90 mL/min (ref >90)
GFR calc non Af Amer: 81 mL/min — ABNORMAL LOW (ref >90)
Glucose, Bld: 80 mg/dL (ref 70–99)
Potassium: 3.8 mEq/L (ref 3.5–5.1)
Sodium: 137 mEq/L (ref 135–145)
Total Bilirubin: 0.3 mg/dL (ref 0.3–1.2)
Total Protein: 5.4 g/dL — ABNORMAL LOW (ref 6.0–8.3)

## 2011-06-21 MED ORDER — SENNOSIDES-DOCUSATE SODIUM 8.6-50 MG PO TABS: 2.0000 | ORAL_TABLET | Freq: Every day | ORAL | Status: DC

## 2011-06-21 MED ORDER — IBUPROFEN 600 MG PO TABS: 600.0000 mg | ORAL_TABLET | Freq: Four times a day (QID) | ORAL | Status: AC

## 2011-06-21 MED ORDER — PRENATAL PLUS 27-1 MG PO TABS: 1.0000 | ORAL_TABLET | Freq: Every day | ORAL | Status: DC

## 2011-06-21 NOTE — Discharge Summary (Signed)
Obstetric Discharge Summary Reason for Admission: onset of labor Prenatal Procedures: NST Intrapartum Procedures: spontaneous vaginal delivery Postpartum Procedures: antibiotics Complications-Operative and Postpartum: 2nd degree perineal laceration and Chorioamnionitis Hemoglobin  Date Value Range Status  06/18/2011 11.4* 12.0-15.0 (g/dL) Final     HCT  Date Value Range Status  06/18/2011 34.4* 36.0-46.0 (%) Final    Discharge Diagnoses: Term Pregnancy-delivered and Amnionitis  Elizabeth Horton 25 y.o. female  Now G1P1001 who presented at [redacted]w[redacted]d and delivered a viable female was delivered via Vaginal, Spontaneous Delivery (Presentation: Left Occiput Anterior).  APGAR: 4, 7; weight 7 lb 10.6 oz (3475 g).   Placenta status: Intact, Spontaneous.  Cord: 3 vessels with the following complications: 1 minute 15 second shoulder dystocia.  Delivery of the head occurred at 9:13am.  During the first 30 seconds, downward traction was preformed with McRoberts and suprapubic pressure, without success.  A shoulder dystocia code was called.  At 9:13:30, suprapubic pressure was continued as I attempted to feel for the posterior shoulder.  Despite multiple attempts, I was unable to reduce the posterior shoulder.  Dr Marice Potter arrived at 9:14:10 and resolved the shoulder dystocia by 9:14:15.    2nd degree perineal laceration repaired with 3.0 vicryl.   Patient also had an intrapartum fever/chorioamnionitis which was treated with ampicillin and gentamicin. She had an elevated Cr after this regimen was started to 1.1 which was reduced to 0.97 before discharge.   Plans to breast feed through pumping as child in NICU. Micronor for birth control   Discharge Information: Date: 06/21/2011 Activity: pelvic rest Diet: routine Medications: PNV, Ibuprofen and Colace Condition: stable Instructions: refer to practice specific booklet Discharge to: home Follow-up Information    Follow up with [facility] in 6  weeks.   Contact information:   [address] [place] [address] [phone]         Newborn Data: Live born female  Birth Weight: 7 lb 10.6 oz (3475 g) APGAR: 4, 7. Required resuscitation and transfer to NICU.   To remain in NICU for 7-14 day course pending improvement. Marland Kitchen  Elizabeth Horton 06/21/2011, 7:57 AM

## 2011-06-21 NOTE — Progress Notes (Signed)
Post Partum Day 2 Subjective: up ad lib, voiding, tolerating PO and + flatus  Objective: Blood pressure 110/70, pulse 82, temperature 97.1 F (36.2 C), temperature source Oral, resp. rate 18, height 5\' 3"  (1.6 m), weight 87.544 kg (193 lb), SpO2 99.00%, unknown if currently breastfeeding.  Physical Exam:  General: alert, cooperative and no distress Lochia: appropriate Uterine Fundus: firm DVT Evaluation: No cords or calf tenderness. No significant calf/ankle edema.   Basename 06/18/11 1010  HGB 11.4*  HCT 34.4*    Assessment/Plan: Discharge home, Breastfeeding and Contraception Micronor   LOS: 3 days   Carless Slatten 06/21/2011, 7:53 AM

## 2011-06-21 NOTE — Progress Notes (Signed)
06/21/11 1030 D/C instructions & prescriptions given - pt awaiting ride home.

## 2011-06-23 NOTE — Discharge Summary (Signed)
Chart reviewed and agree with management and plan.  

## 2011-07-16 ENCOUNTER — Emergency Department (HOSPITAL_COMMUNITY): Payer: Medicaid Other

## 2011-07-16 ENCOUNTER — Other Ambulatory Visit: Payer: Self-pay

## 2011-07-16 ENCOUNTER — Emergency Department (HOSPITAL_COMMUNITY)
Admission: EM | Admit: 2011-07-16 | Discharge: 2011-07-16 | Disposition: A | Discharge status: Home / Self Care | Payer: Medicaid Other | Attending: Emergency Medicine | Admitting: Emergency Medicine

## 2011-07-16 ENCOUNTER — Encounter (HOSPITAL_COMMUNITY): Payer: Self-pay | Admitting: Emergency Medicine

## 2011-07-16 DIAGNOSIS — R0602 Shortness of breath: Secondary | ICD-10-CM | POA: Insufficient documentation

## 2011-07-16 DIAGNOSIS — R002 Palpitations: Secondary | ICD-10-CM | POA: Insufficient documentation

## 2011-07-16 DIAGNOSIS — R21 Rash and other nonspecific skin eruption: Secondary | ICD-10-CM | POA: Insufficient documentation

## 2011-07-16 DIAGNOSIS — R0789 Other chest pain: Secondary | ICD-10-CM | POA: Insufficient documentation

## 2011-07-16 LAB — DIFFERENTIAL
Basophils Absolute: 0 10*3/uL (ref 0.0–0.1)
Basophils Relative: 1 % (ref 0–1)
Eosinophils Absolute: 0.3 10*3/uL (ref 0.0–0.7)
Eosinophils Relative: 4 % (ref 0–5)
Lymphocytes Relative: 42 % (ref 12–46)
Lymphs Abs: 3 10*3/uL (ref 0.7–4.0)
Monocytes Absolute: 0.5 10*3/uL (ref 0.1–1.0)
Monocytes Relative: 7 % (ref 3–12)
Neutro Abs: 3.3 10*3/uL (ref 1.7–7.7)
Neutrophils Relative %: 47 % (ref 43–77)

## 2011-07-16 LAB — COMPREHENSIVE METABOLIC PANEL
ALT: 24 U/L (ref 0–35)
AST: 22 U/L (ref 0–37)
Albumin: 3.2 g/dL — ABNORMAL LOW (ref 3.5–5.2)
Alkaline Phosphatase: 69 U/L (ref 39–117)
BUN: 8 mg/dL (ref 6–23)
CO2: 26 mEq/L (ref 19–32)
Calcium: 9.2 mg/dL (ref 8.4–10.5)
Chloride: 104 mEq/L (ref 96–112)
Creatinine, Ser: 1.22 mg/dL — ABNORMAL HIGH (ref 0.50–1.10)
GFR calc Af Amer: 71 mL/min — ABNORMAL LOW (ref >90)
GFR calc non Af Amer: 61 mL/min — ABNORMAL LOW (ref >90)
Glucose, Bld: 94 mg/dL (ref 70–99)
Potassium: 3.6 mEq/L (ref 3.5–5.1)
Sodium: 138 mEq/L (ref 135–145)
Total Bilirubin: 0.6 mg/dL (ref 0.3–1.2)
Total Protein: 7.3 g/dL (ref 6.0–8.3)

## 2011-07-16 LAB — CBC
HCT: 33.2 % — ABNORMAL LOW (ref 36.0–46.0)
Hemoglobin: 10.6 g/dL — ABNORMAL LOW (ref 12.0–15.0)
MCH: 26.4 pg (ref 26.0–34.0)
MCHC: 31.9 g/dL (ref 30.0–36.0)
MCV: 82.8 fL (ref 78.0–100.0)
Platelets: 303 10*3/uL (ref 150–400)
RBC: 4.01 MIL/uL (ref 3.87–5.11)
RDW: 15.6 % — ABNORMAL HIGH (ref 11.5–15.5)
WBC: 7.2 10*3/uL (ref 4.0–10.5)

## 2011-07-16 LAB — POCT I-STAT TROPONIN I: Troponin i, poc: 0 ng/mL (ref 0.00–0.08)

## 2011-07-16 LAB — D-DIMER, QUANTITATIVE: D-Dimer, Quant: 0.59 ug/mL-FEU — ABNORMAL HIGH (ref 0.00–0.48)

## 2011-07-16 MED ORDER — IOHEXOL 350 MG/ML SOLN
100.0000 mL | Freq: Once | INTRAVENOUS | Status: AC | PRN
  Administered 2011-07-16: 100 mL via INTRAVENOUS

## 2011-07-16 NOTE — ED Notes (Signed)
Had to draw blood for I-stat Trop., lab did not draw with other lab work

## 2011-07-16 NOTE — ED Notes (Signed)
Patient c/o palpitations/chest tightness with some shortness of breath. Per patient just had baby 3 weeks ago.

## 2011-07-16 NOTE — ED Notes (Signed)
Patient has stitches from tearing during labor. Patient reports tear, concerned about infection. Patient also has ? bug bite to left leg. Area red and risen.

## 2011-07-16 NOTE — ED Provider Notes (Signed)
History    This chart was scribed for Benny Lennert, MD, MD by Smitty Pluck. The patient was seen in room APA16A and the patient's care was started at 6:32PM.   CSN: [id number]  Arrival date & time 07/16/11  1743   First MD Initiated Contact with Patient 07/16/11 1826      Chief Complaint  Patient presents with  . Palpitations  . Chest Pain    (Consider location/radiation/quality/duration/timing/severity/associated sxs/prior treatment) Patient is a 25 y.o. female presenting with palpitations and chest pain. The history is provided by the patient.  Palpitations  Associated symptoms include chest pain.  Chest Pain Primary symptoms include palpitations.    Elizabeth Horton is a 25 y.o. female who presents to the Emergency Department complaining of SOB and tightness in chest onset 1 hour pta today. Pt reports having sharp pain in chest and "hot flashes". She denies having any current pain but her breathing "seems off." Pt reports having a baby 3 weeks ago. She states she got an insect bite on legs 3 days ago.   Past Medical History  Diagnosis Date  . No pertinent past medical history     Past Surgical History  Procedure Date  . No past surgeries     Family History  Problem Relation Age of Onset  . Cancer Father     History  Substance Use Topics  . Smoking status: Never Smoker   . Smokeless tobacco: Never Used  . Alcohol Use: No    OB History    Grav Para Term Preterm Abortions TAB SAB Ect Mult Living   1 1 1  0 0 0 0 0 0 1      Review of Systems  Cardiovascular: Positive for chest pain and palpitations.  All other systems reviewed and are negative.   10 Systems reviewed and are negative for acute change except as noted in the HPI.  Allergies  Review of patient's allergies indicates no known allergies.  Home Medications   Current Outpatient Rx  Name Route Sig Dispense Refill  . HYDROCODONE-ACETAMINOPHEN 5-500 MG PO TABS Oral Take 1 tablet by mouth  every 6 (six) hours as needed. For pain     . PRENATAL PLUS 27-1 MG PO TABS Oral Take 1 tablet by mouth daily. 30 each 5    BP 99/71  Pulse 74  Temp(Src) 98.6 F (37 C) (Oral)  Resp 14  Ht 5\' 3"  (1.6 m)  Wt 169 lb (76.658 kg)  BMI 29.94 kg/m2  SpO2 100%  LMP 06/19/2011  Breastfeeding? Yes  Physical Exam  Nursing note and vitals reviewed. Constitutional: She is oriented to person, place, and time. She appears well-developed and well-nourished.  HENT:  Head: Normocephalic and atraumatic.  Eyes: Conjunctivae and EOM are normal. Pupils are equal, round, and reactive to light. No scleral icterus.  Neck: Normal range of motion. Neck supple. No thyromegaly present.  Cardiovascular: Normal rate and regular rhythm.  Exam reveals no gallop and no friction rub.   No murmur heard. Pulmonary/Chest: Effort normal and breath sounds normal. No stridor. She has no wheezes. She has no rales. She exhibits no tenderness.  Abdominal: Soft. Bowel sounds are normal. She exhibits no distension. There is no tenderness. There is no rebound.  Musculoskeletal: Normal range of motion. She exhibits no edema.  Lymphadenopathy:    She has no cervical adenopathy.  Neurological: She is alert and oriented to person, place, and time. Coordination normal.  Skin: Skin is warm and dry. Rash (  minor left ankle ) noted. No erythema.  Psychiatric: She has a normal mood and affect. Her behavior is normal.    ED Course  Procedures (including critical care time)  DIAGNOSTIC STUDIES: Oxygen Saturation is 100% on room air, normal by my interpretation.    COORDINATION OF CARE:  9:33PM Recheck: EDP discussed lab results and ordered CT Chest.   11:04PM Recheck: EDP discussed lab results and treatment course with pt. Pt is feeling better. Pt is ready for discharge.    Labs Reviewed  D-DIMER, QUANTITATIVE - Abnormal; Notable for the following:    D-Dimer, Quant 0.59 (*)    All other components within normal limits    CBC - Abnormal; Notable for the following:    Hemoglobin 10.6 (*)    HCT 33.2 (*)    RDW 15.6 (*)    All other components within normal limits  COMPREHENSIVE METABOLIC PANEL - Abnormal; Notable for the following:    Creatinine, Ser 1.22 (*)    Albumin 3.2 (*)    GFR calc non Af Amer 61 (*)    GFR calc Af Amer 71 (*)    All other components within normal limits  DIFFERENTIAL  POCT I-STAT TROPONIN I  I-STAT TROPONIN I   Dg Chest 2 View  07/16/2011  *RADIOLOGY REPORT*  Clinical Data: Chest tightness, shortness of breath  CHEST - 2 VIEW  Comparison: None.  Findings: Normal cardiac silhouette and mediastinal contour.  No focal parenchymal opacities.  No pleural effusion or pneumothorax. No acute osseous abnormalities.  IMPRESSION: No acute cardiopulmonary disease.  Original Report Authenticated By: Waynard Reeds, M.D.   Ct Angio Chest W/cm &/or Wo Cm  07/16/2011  *RADIOLOGY REPORT*  Clinical Data:  Chest pain and shortness of breath; recently postpartum.  CT ANGIOGRAPHY CHEST WITH CONTRAST  Technique:  Multidetector CT imaging of the chest was performed using the standard protocol during bolus administration of intravenous contrast.  Multiplanar CT image reconstructions including MIPs were obtained to evaluate the vascular anatomy.  Contrast: OMNIPAQUE IOHEXOL 350 MG/ML IV SOLN  Comparison:  Chest radiograph performed earlier today at 08:19 p.m.  Findings:  There is no evidence of pulmonary embolus.  The lungs are clear bilaterally.  There is no evidence of significant focal consolidation, pleural effusion or pneumothorax. No masses are identified; no abnormal focal contrast enhancement is seen.  The mediastinum is unremarkable in appearance.  There is no evidence of mediastinal lymphadenopathy.  No pericardial effusion is seen.  The great vessels are unremarkable in appearance.  No axillary lymphadenopathy is seen.  The visualized portions of the thyroid gland are unremarkable in  appearance.  The visualized portions of the liver and spleen are unremarkable.  No acute osseous abnormalities are seen.  Review of the MIP images confirms the above findings.  IMPRESSION: Unremarkable CTA of the chest.  Original Report Authenticated By: Tonia Ghent, M.D.     1. Palpitations      Date: 07/16/2011  Rate: 84  Rhythm: sinus arrhythmia  QRS Axis: normal  Intervals: normal  ST/T Wave abnormalities: normal  Conduction Disutrbances:none  Narrative Interpretation:   Old EKG Reviewed: none available    MDM        The chart was scribed for me under my direct supervision.  I personally performed the history, physical, and medical decision making and all procedures in the evaluation of this patient.Benny Lennert, MD 07/16/11 [phone]

## 2011-07-24 ENCOUNTER — Ambulatory Visit (INDEPENDENT_AMBULATORY_CARE_PROVIDER_SITE_OTHER): Payer: Medicaid Other | Admitting: Adult Health

## 2011-07-24 ENCOUNTER — Encounter: Payer: Self-pay | Admitting: Adult Health

## 2011-07-24 DIAGNOSIS — R000 Tachycardia, unspecified: Secondary | ICD-10-CM | POA: Insufficient documentation

## 2011-07-24 DIAGNOSIS — R0600 Dyspnea, unspecified: Secondary | ICD-10-CM | POA: Insufficient documentation

## 2011-07-24 DIAGNOSIS — R002 Palpitations: Secondary | ICD-10-CM

## 2011-07-24 DIAGNOSIS — R0609 Other forms of dyspnea: Secondary | ICD-10-CM

## 2011-07-24 DIAGNOSIS — R0989 Other specified symptoms and signs involving the circulatory and respiratory systems: Secondary | ICD-10-CM

## 2011-07-24 NOTE — Patient Instructions (Signed)
Your physician recommends that you schedule a follow-up appointment in: f/u post testing  Your physician recommends that you return for lab work in: today  Your physician has requested that you have an echocardiogram. Echocardiography is a painless test that uses sound waves to create images of your heart. It provides your doctor with information about the size and shape of your heart and how well your heart's chambers and valves are working. This procedure takes approximately one hour. There are no restrictions for this procedure.

## 2011-07-24 NOTE — Assessment & Plan Note (Signed)
She is mildly anemic per ER labs with Hgb of 10.6, and continues to have some intermittent bleeding post partem. Will check CBC for continued evaluation. She is to see her OB/Gyn Dr. Emelda Fear in 2 weeks for 6 week check up.

## 2011-07-24 NOTE — Progress Notes (Signed)
   HPI: [name] a 26 y/o AAF we are seeing post ER visit on Jul 16, 2011. She was seen there for complaints of flushing, palpitations, racing heart rate, and dyspnea. This occurred while at home watching a movie with progression of symptoms throughout the day for about 7 hours. She was seen in ER, and HR had come down. She still felt short of breath however. She was 3 weeks post-partem on ER visit. CT scan was negative for PE, CXR negative for CHF.  EKG showed NSR with sinus arrythmia rate of 84 bpm. Hgb was found to be 10.6. She states since last week, she has had no further complaints of heart racing, but continues to have occasional palpitations and chest discomfort, described as pressure. No radiation.   No Known Allergies  Current Outpatient Prescriptions  Medication Sig Dispense Refill  . prenatal vitamin w/FE, FA (PRENATAL 1 + 1) 27-1 MG TABS Take 1 tablet by mouth daily.  30 each  5    Past Medical History  Diagnosis Date  . No pertinent past medical history     Past Surgical History  Procedure Date  . No past surgeries     Family History  Problem Relation Age of Onset  . Cancer Father   . Hypertension Mother   . Thyroid disease Mother     History   Social History  . Marital Status: Single    Spouse Name: N/A    Number of Children: N/A  . Years of Education: N/A   Occupational History  . Not on file.   Social History Main Topics  . Smoking status: Never Smoker   . Smokeless tobacco: Never Used  . Alcohol Use: No  . Drug Use: No  . Sexually Active: Yes    Birth Control/ Protection: None   Other Topics Concern  . Not on file   Social History Narrative  . No narrative on file    OZH:YQMVHQ of systems complete and found to be negative unless listed above   PHYSICAL EXAM BP 118/71  Pulse 71  Ht 5\' 3"  (1.6 m)  Wt 177 lb (80.287 kg)  BMI 31.35 kg/m2  LMP 06/19/2011  General: Well developed, well nourished, in no acute distress Head: Eyes  PERRLA, No xanthomas.   Normal cephalic and atramatic  Lungs: Clear bilaterally to auscultation and percussion. Heart: HRRR S1 S2, with occasional irregular systole,without MRG.  Pulses are 2+ & equal.            No carotid bruit. No JVD.  No abdominal bruits. No femoral bruits. Abdomen: Bowel sounds are positive, abdomen soft and non-tender without masses or                  Hernia's noted. Msk:  Back normal, normal gait. Normal strength and tone for age. Extremities: No clubbing, cyanosis or edema.  DP +1 Neuro: Alert and oriented X 3. Psych:  Good affect, responds appropriately  EKG: Sinus rhythm with marked sinus arrythmia, rate of 71 bpm.  ASSESSMENT AND PLAN

## 2011-07-24 NOTE — Assessment & Plan Note (Signed)
Review of EKG does not show evidence of pre-excitation. She does have nonspecific  Sinus arrythmia. I will check a TSH and Echocardiogram for LV fx. She has not had any further episodes but does have some complaints of palpitations. Will will follow once tests are completed.

## 2011-07-25 ENCOUNTER — Ambulatory Visit (HOSPITAL_COMMUNITY)
Admission: RE | Admit: 2011-07-25 | Discharge: 2011-07-25 | Disposition: A | Discharge status: Home / Self Care | Payer: Medicaid Other | Source: Ambulatory Visit | Attending: Adult Health | Admitting: Adult Health

## 2011-07-25 DIAGNOSIS — R0989 Other specified symptoms and signs involving the circulatory and respiratory systems: Secondary | ICD-10-CM | POA: Insufficient documentation

## 2011-07-25 DIAGNOSIS — R0609 Other forms of dyspnea: Secondary | ICD-10-CM

## 2011-07-25 DIAGNOSIS — R079 Chest pain, unspecified: Secondary | ICD-10-CM | POA: Insufficient documentation

## 2011-07-25 DIAGNOSIS — R002 Palpitations: Secondary | ICD-10-CM

## 2011-07-25 DIAGNOSIS — R000 Tachycardia, unspecified: Secondary | ICD-10-CM | POA: Insufficient documentation

## 2011-07-25 NOTE — Progress Notes (Signed)
*  PRELIMINARY RESULTS* Echocardiogram 2D Echocardiogram has been performed.  Conrad Enon 07/25/2011, 3:00 PM

## 2011-07-26 LAB — CBC
HCT: 36.6 % (ref 36.0–46.0)
Hemoglobin: 11.7 g/dL — ABNORMAL LOW (ref 12.0–15.0)
MCH: 26.2 pg (ref 26.0–34.0)
MCHC: 32 g/dL (ref 30.0–36.0)
MCV: 81.9 fL (ref 78.0–100.0)
Platelets: 333 10*3/uL (ref 150–400)
RBC: 4.47 MIL/uL (ref 3.87–5.11)
RDW: 15.7 % — ABNORMAL HIGH (ref 11.5–15.5)
WBC: 7.3 10*3/uL (ref 4.0–10.5)

## 2011-07-26 LAB — TSH: TSH: 1.694 u[IU]/mL (ref 0.350–4.500)

## 2011-08-01 ENCOUNTER — Encounter: Payer: Self-pay | Admitting: Adult Health

## 2011-08-01 ENCOUNTER — Ambulatory Visit (INDEPENDENT_AMBULATORY_CARE_PROVIDER_SITE_OTHER): Payer: Medicaid Other | Admitting: Adult Health

## 2011-08-01 VITALS — BP 112/68 | HR 68 | Resp 16 | Ht 63.0 in | Wt 174.0 lb

## 2011-08-01 DIAGNOSIS — R0609 Other forms of dyspnea: Secondary | ICD-10-CM

## 2011-08-01 DIAGNOSIS — R0600 Dyspnea, unspecified: Secondary | ICD-10-CM

## 2011-08-01 NOTE — Patient Instructions (Signed)
Your physician recommends that you schedule a follow-up appointment in: as needed  

## 2011-08-02 ENCOUNTER — Encounter: Payer: Self-pay | Admitting: Adult Health

## 2011-08-02 NOTE — Progress Notes (Signed)
   HPI: [name] a 26 y/o AAF we are seeing post ER visit on Jul 16, 2011. She was seen there for complaints of flushing, palpitations, racing heart rate, and dyspnea. This occurred while at home watching a movie with progression of symptoms throughout the day for about 7 hours. She was seen in ER, and HR had come down. She still felt short of breath however. She was 3 weeks post-partem on ER visit. CT scan was negative for PE, CXR negative for CHF.  EKG showed NSR with sinus arrythmia rate of 84 bpm. Hgb was found to be 10.6. She states since last week, she has had no further complaints of heart racing, but continues to have occasional palpitations and chest discomfort, described as pressure. No radiation.   On last visit, she was ordered a echo. She is here for discussion. She has been feeling better and is without complaint. No Known Allergies  Current Outpatient Prescriptions  Medication Sig Dispense Refill  . cephALEXin (KEFLEX) 500 MG capsule Take 500 mg by mouth 2 (two) times daily.      . prenatal vitamin w/FE, FA (PRENATAL 1 + 1) 27-1 MG TABS Take 1 tablet by mouth daily.  30 each  5    Past Medical History  Diagnosis Date  . No pertinent past medical history     Past Surgical History  Procedure Date  . No past surgeries     Family History  Problem Relation Age of Onset  . Cancer Father   . Hypertension Mother   . Thyroid disease Mother     History   Social History  . Marital Status: Single    Spouse Name: N/A    Number of Children: N/A  . Years of Education: N/A   Occupational History  . Not on file.   Social History Main Topics  . Smoking status: Never Smoker   . Smokeless tobacco: Never Used  . Alcohol Use: No  . Drug Use: No  . Sexually Active: Yes    Birth Control/ Protection: None   Other Topics Concern  . Not on file   Social History Narrative  . No narrative on file    RUE:AVWUJW of systems complete and found to be negative unless  listed above   PHYSICAL EXAM BP 112/68  Pulse 68  Resp 16  Ht 5\' 3"  (1.6 m)  Wt 174 lb (78.926 kg)  BMI 30.82 kg/m2  LMP 06/19/2011  General: Well developed, well nourished, in no acute distress Head: Eyes PERRLA, No xanthomas.   Normal cephalic and atramatic  Lungs: Clear bilaterally to auscultation and percussion. Heart: HRRR S1 S2, with occasional irregular systole,without MRG.  Pulses are 2+ & equal.            No carotid bruit. No JVD.  No abdominal bruits. No femoral bruits. Abdomen: Bowel sounds are positive, abdomen soft and non-tender without masses or                  Hernia's noted. Msk:  Back normal, normal gait. Normal strength and tone for age. Extremities: No clubbing, cyanosis or edema.  DP +1 Neuro: Alert and oriented X 3. Psych:  Good affect, responds appropriately  EKG: Sinus rhythm with marked sinus arrythmia, rate of 71 bpm.  ASSESSMENT AND PLAN

## 2011-08-02 NOTE — Assessment & Plan Note (Signed)
Echocardiogram was WNL . She is not completely asymptomatic.  We will see her on prn basis only.

## 2011-11-14 ENCOUNTER — Telehealth: Payer: Self-pay | Admitting: Cardiology

## 2011-11-14 NOTE — Telephone Encounter (Signed)
Elizabeth Horton at [facility]. Said that she got the fax we sent but did not get the TSH results.  Please fax to her attention.

## 2011-11-19 ENCOUNTER — Encounter: Payer: Self-pay | Admitting: Gastroenterology

## 2011-11-19 ENCOUNTER — Ambulatory Visit (INDEPENDENT_AMBULATORY_CARE_PROVIDER_SITE_OTHER): Payer: Medicaid Other | Admitting: Gastroenterology

## 2011-11-19 VITALS — BP 105/70 | HR 59 | Temp 98.1°F | Ht 64.0 in | Wt 179.2 lb

## 2011-11-19 DIAGNOSIS — K625 Hemorrhage of anus and rectum: Secondary | ICD-10-CM

## 2011-11-19 DIAGNOSIS — Z800 Family history of malignant neoplasm of digestive organs: Secondary | ICD-10-CM

## 2011-11-19 NOTE — Progress Notes (Signed)
Cc to [facility] 

## 2011-11-19 NOTE — Assessment & Plan Note (Addendum)
Rectal bleeding and FH of CRC in two first degree relatives at young age. Recommend colonoscopy in near future.  I have discussed the risks, alternatives, benefits with regards to but not limited to the risk of reaction to medication, bleeding, infection, perforation and the patient is agreeable to proceed. Written consent to be obtained.  Patient has been advised that she will not be able to breastfeed for 24 hours after anesthesia. She needs to make arrangements for freezing breastmilk and pump/dump for 24 hours after EGD. Discussed with Dr. Darrick Penna.

## 2011-11-19 NOTE — Progress Notes (Deleted)
Primary Care Physician:  MUSE,ROCHELLE D., PA, PA-C  Primary Gastroenterologist:  Jonette Eva, MD   Chief Complaint  Patient presents with  . Colonoscopy    HPI:  Elizabeth Horton is a 26 y.o. female here   Some brbpr on toilet tissue. No heartburn, n/v, abdominal pain, melena, constipation, diarrhea. Breast findings.   Current Outpatient Prescriptions  Medication Sig Dispense Refill  . prenatal vitamin w/FE, FA (PRENATAL 1 + 1) 27-1 MG TABS Take 1 tablet by mouth daily.        Allergies as of 11/19/2011  . (No Known Allergies)    No past medical history on file.  No past surgical history on file.  Family History  Problem Relation Age of Onset  . Colon cancer Father   . Colon cancer Sister     History   Social History  . Marital Status: Married    Spouse Name: N/A    Number of Children: N/A  . Years of Education: N/A   Occupational History  . Not on file.   Social History Main Topics  . Smoking status: Never Smoker   . Smokeless tobacco: Not on file  . Alcohol Use: No  . Drug Use: No  . Sexually Active: Not on file   Other Topics Concern  . Not on file   Social History Narrative  . No narrative on file      ROS:  General: Negative for anorexia, weight loss, fever, chills, fatigue, weakness. Eyes: Negative for vision changes.  ENT: Negative for hoarseness, difficulty swallowing , nasal congestion. CV: Negative for chest pain, angina, palpitations, dyspnea on exertion, peripheral edema.  Respiratory: Negative for dyspnea at rest, dyspnea on exertion, cough, sputum, wheezing.  GI: See history of present illness. GU:  Negative for dysuria, hematuria, urinary incontinence, urinary frequency, nocturnal urination.  MS: Negative for joint pain, low back pain.  Derm: Negative for rash or itching.  Neuro: Negative for weakness, abnormal sensation, seizure, frequent headaches, memory loss, confusion.  Psych: Negative for anxiety, depression, suicidal  ideation, hallucinations.  Endo: Negative for unusual weight change.  Heme: Negative for bruising or bleeding. Allergy: Negative for rash or hives.    Physical Examination:  BP 105/70  Pulse 59  Temp(Src) 98.1 F (36.7 C) (Temporal)  Ht 5\' 4"  (1.626 m)  Wt 179 lb 3.2 oz (81.285 kg)  BMI 30.76 kg/m2   General: Well-nourished, well-developed in no acute distress.  Head: Normocephalic, atraumatic.   Eyes: Conjunctiva pink, no icterus. Mouth: Oropharyngeal mucosa moist and pink , no lesions erythema or exudate. Neck: Supple without thyromegaly, masses, or lymphadenopathy.  Lungs: Clear to auscultation bilaterally.  Heart: Regular rate and rhythm, no murmurs rubs or gallops.  Abdomen: Bowel sounds are normal, nontender, nondistended, no hepatosplenomegaly or masses, no abdominal bruits or    hernia , no rebound or guarding.   Rectal: *** Extremities: No lower extremity edema. No clubbing or deformities.  Neuro: Alert and oriented x 4 , grossly normal neurologically.  Skin: Warm and dry, no rash or jaundice.   Psych: Alert and cooperative, normal mood and affect.  Labs: ***  Imaging Studies: No results found.

## 2011-11-19 NOTE — Progress Notes (Signed)
Primary Care Physician:  [facility]  Primary Gastroenterologist:  Jonette Eva, MD   Chief Complaint  Patient presents with  . Rectal Bleeding    family history of colon cancer    HPI:  Elizabeth Horton is a 26 y.o. female here to schedule colonoscopy. FH significant for colon cancer in her father, at age 38, and her sister at age 22. Sister ([place] just recently diagnosed and has not undergone colon surgery. Patient has brbpr on toilet tissue intermittently. No heartburn, n/v, abd pain, melena, constipation, diarrhea. She has 85 month old son who she is breastfeeding.   Current Outpatient Prescriptions  Medication Sig Dispense Refill  . prenatal vitamin w/FE, FA (PRENATAL 1 + 1) 27-1 MG TABS Take 1 tablet by mouth daily.  30 each  5    Allergies as of 11/19/2011  . (No Known Allergies)    Past Medical History  Diagnosis Date  . No pertinent past medical history     Past Surgical History  Procedure Date  . No past surgeries     Family History  Problem Relation Age of Onset  . Colon cancer Father     diagnosed age 60, deceased age 19  . Hypertension Mother   . Thyroid disease Mother   . Colon cancer Sister     diagnosed recently age 60, has not had colon surgery yet (11/19/11)  . Breast cancer Other     aunt    History   Social History  . Marital Status: Single    Spouse Name: N/A    Number of Children: 1  . Years of Education: N/A   Occupational History  . PT, cleans houses    Social History Main Topics  . Smoking status: Never Smoker   . Smokeless tobacco: Never Used  . Alcohol Use: No  . Drug Use: No  . Sexually Active: Yes    Birth Control/ Protection: None   Other Topics Concern  . Not on file   Social History Narrative  . No narrative on file      ROS:  General: Negative for anorexia, weight loss, fever, chills, fatigue, weakness. Eyes: Negative for vision changes.  ENT: Negative for hoarseness,  difficulty swallowing , nasal congestion. CV: Negative for chest pain, angina, palpitations, dyspnea on exertion, peripheral edema.  Respiratory: Negative for dyspnea at rest, dyspnea on exertion, cough, sputum, wheezing.  GI: See history of present illness. GU:  Negative for dysuria, hematuria, urinary incontinence, urinary frequency, nocturnal urination.  MS: Negative for joint pain, low back pain.  Derm: Negative for rash or itching.  Neuro: Negative for weakness, abnormal sensation, seizure, frequent headaches, memory loss, confusion.  Psych: Negative for anxiety, depression, suicidal ideation, hallucinations.  Endo: Negative for unusual weight change.  Heme: Negative for bruising or bleeding. Allergy: Negative for rash or hives.    Physical Examination:  BP 105/70  Pulse 59  Temp(Src) 98.1 F (36.7 C) (Temporal)  Ht 5\' 4"  (1.626 m)  Wt 179 lb 3.2 oz (81.285 kg)  BMI 30.76 kg/m2  Breastfeeding? Yes   General: Well-nourished, well-developed in no acute distress.  Head: Normocephalic, atraumatic.   Eyes: Conjunctiva pink, no icterus. Mouth: Oropharyngeal mucosa moist and pink , no lesions erythema or exudate. Neck: Supple without thyromegaly, masses, or lymphadenopathy.  Lungs: Clear to auscultation bilaterally.  Heart: Regular rate and rhythm, no murmurs rubs or gallops.  Abdomen: Bowel sounds are normal, nontender, nondistended, no hepatosplenomegaly or masses, no abdominal  bruits or    hernia , no rebound or guarding.   Rectal: defer to time of tcs Extremities: No lower extremity edema. No clubbing or deformities.  Neuro: Alert and oriented x 4 , grossly normal neurologically.  Skin: Warm and dry, no rash or jaundice.   Psych: Alert and cooperative, normal mood and affect.  Labs: Lab Results  Component Value Date   WBC 7.3 07/25/2011   HGB 11.7* 07/25/2011   HCT 36.6 07/25/2011   MCV 81.9 07/25/2011   PLT 333 07/25/2011     Imaging Studies: No results found.

## 2011-11-20 NOTE — Progress Notes (Signed)
Opened in error

## 2011-11-21 ENCOUNTER — Encounter: Payer: Self-pay | Admitting: Gastroenterology

## 2011-11-24 ENCOUNTER — Telehealth: Payer: Self-pay | Admitting: Gastroenterology

## 2011-11-24 NOTE — Telephone Encounter (Signed)
Message copied by Irish Elders on Mon Nov 24, 2011 12:25 PM ------      Message from: Tiffany Kocher      Created: Wed Nov 19, 2011  4:32 PM       Okay to go ahead and schedule colonoscopy but need to put off for about two weeks to give patient time to freeze breastmilk. Please let pt know, she will not be able to breastfeed for 24 hours after anesthesia. She will have to pump and dump during that timeframe. Discussed with Dr. Darrick Penna.

## 2011-11-24 NOTE — Telephone Encounter (Signed)
Tried to call pt- but no answer - did leave a message

## 2011-11-27 NOTE — Progress Notes (Signed)
REVIEWED.  TCS IN 2 WEEKS-PT NEEDS TO STORE BREAST MILK

## 2012-01-08 ENCOUNTER — Emergency Department (HOSPITAL_COMMUNITY): Payer: Medicaid Other

## 2012-01-08 ENCOUNTER — Encounter (HOSPITAL_COMMUNITY): Payer: Self-pay

## 2012-01-08 ENCOUNTER — Emergency Department (HOSPITAL_COMMUNITY)
Admission: EM | Admit: 2012-01-08 | Discharge: 2012-01-08 | Disposition: A | Discharge status: Home / Self Care | Payer: Medicaid Other | Attending: Emergency Medicine | Admitting: Emergency Medicine

## 2012-01-08 DIAGNOSIS — J189 Pneumonia, unspecified organism: Secondary | ICD-10-CM | POA: Insufficient documentation

## 2012-01-08 DIAGNOSIS — R0602 Shortness of breath: Secondary | ICD-10-CM | POA: Insufficient documentation

## 2012-01-08 DIAGNOSIS — R079 Chest pain, unspecified: Secondary | ICD-10-CM | POA: Insufficient documentation

## 2012-01-08 MED ORDER — AZITHROMYCIN 250 MG PO TABS: ORAL_TABLET | ORAL | Status: DC

## 2012-01-08 MED ORDER — CEFTRIAXONE SODIUM 1 G IJ SOLR
1.0000 g | Freq: Once | INTRAMUSCULAR | Status: AC
  Administered 2012-01-08: 1 g via INTRAMUSCULAR
  Filled 2012-01-08: qty 10

## 2012-01-08 MED ORDER — AZITHROMYCIN 250 MG PO TABS
500.0000 mg | ORAL_TABLET | Freq: Once | ORAL | Status: AC
  Administered 2012-01-08: 500 mg via ORAL
  Filled 2012-01-08: qty 2

## 2012-01-08 MED ORDER — ALBUTEROL SULFATE HFA 108 (90 BASE) MCG/ACT IN AERS
2.0000 | INHALATION_SPRAY | RESPIRATORY_TRACT | Status: DC | PRN
  Administered 2012-01-08: 2 via RESPIRATORY_TRACT
  Filled 2012-01-08: qty 6.7

## 2012-01-08 NOTE — ED Provider Notes (Signed)
History   This chart was scribed for Joya Gaskins, MD by Clarita Crane. The patient was seen in room APA04/APA04. Patient's care was started at 0725.    CSN: [id number]  Arrival date & time 01/08/12  0725   First MD Initiated Contact with Patient 01/08/12 [phone]      Chief Complaint  Patient presents with  . Shortness of Breath     HPI Lashina Milles is a 26 y.o. female who presents to the Emergency Department complaining of constant moderate SOB with associated sore throat, post-nasal drainage, intermittent subjective fever and mild diarrhea onset 4 days ago and gradually worsening since. Patient also notes she began experiencing chest pain localized to right side of sternum this morning with deep breathing. States chest pain is not aggravated with palpation. Reports having recent sick contact with son suffering from cold-like symptoms. Patient notes she was evaluated at an Urgent Care facility and had a negative for strep throat screening performed as well as a chest x-ray which revealed a possible pneumonia. Patient notes she was started on a 10 day course of Cefzil on 3 days ago and has experienced no improvement in symptoms since. Denies rhinorrhea, cough, diaphoresis, swelling of lower extremities, abdominal pain, HA, chills, back pain. Patient with no significant history.   Past Medical History  Diagnosis Date  . No pertinent past medical history     Past Surgical History  Procedure Date  . No past surgeries     Family History  Problem Relation Age of Onset  . Lung cancer Paternal Grandfather     43s  . Breast cancer Paternal Grandmother     1s  . Breast cancer Paternal Aunt   . Colon cancer Father     age 77 diagnosed, succumbed at age 44  . Hypertension Mother   . Thyroid disease Mother   . Colon cancer Sister     Three tumors found at time of colonoscopy. age 47.  . Breast cancer Other     aunt    History  Substance Use Topics  . Smoking status: Never  Smoker   . Smokeless tobacco: Never Used  . Alcohol Use: No    OB History    Grav Para Term Preterm Abortions TAB SAB Ect Mult Living   1 1 1  0 0 0 0 0 0 1      Review of Systems A complete 10 system review of systems was obtained and all systems are negative except as noted in the HPI and PMH.   Allergies  Review of patient's allergies indicates no known allergies.  Home Medications   Current Outpatient Rx  Name Route Sig Dispense Refill  . CEFPROZIL 500 MG PO TABS Oral Take 500 mg by mouth 2 (two) times daily. Unsure of name but thinks this is med    . PRENATAL PLUS 27-1 MG PO TABS Oral Take 1 tablet by mouth daily. 30 each 5  . PRENATAL PLUS 27-1 MG PO TABS Oral Take 1 tablet by mouth daily.      BP 133/80  Pulse 81  Temp 98 F (36.7 C) (Oral)  Resp 20  Ht 5\' 4"  (1.626 m)  Wt 178 lb (80.74 kg)  BMI 30.55 kg/m2  SpO2 99%  Breastfeeding? Yes  Physical Exam CONSTITUTIONAL: Well developed/well nourished HEAD AND FACE: Normocephalic/atraumatic EYES: EOMI/PERRL ENMT: Mucous membranes moist, uvula midline, tonsillar hypertrophy noted with exudates NECK: supple no meningeal signs Chest - nontender to palpation CV: S1/S2 noted, no  murmurs/rubs/gallops noted, chest non-tender LUNGS: Lungs are clear to auscultation bilaterally, chest non-tender, no apparent distress ABDOMEN: soft, nontender, no rebound or guarding NEURO: Pt is awake/alert, moves all extremitiesx4 EXTREMITIES: pulses normal, full ROM, no edema noted SKIN: warm, color normal PSYCH: no abnormalities of mood noted;   ED Course  Procedures   DIAGNOSTIC STUDIES: Oxygen Saturation is 99% on room air, normal by my interpretation.    COORDINATION OF CARE: 7:30AM-Patient informed of current plan for treatment and evaluation and agrees with plan at this time. Will repeat CXR.   8:41 AM Pneumonia noted Will change antibiotics to azithromycin, and also give injection of rocephin as cefzil is 2nd generation  cephalosporin Pt otherwise well and nontoxic, no hypoxia noted.  Doubt PE at this time.  Unlikely this is ACS.    The patient appears reasonably screened and/or stabilized for discharge and I doubt any other medical condition or other [facility] requiring further screening, evaluation, or treatment in the ED at this time prior to discharge.     Labs Reviewed - No data to display Dg Chest 2 View  01/08/2012  *RADIOLOGY REPORT*  Clinical Data: Congestion and short of breath.  CHEST - 2 VIEW  Comparison: 07/16/2011 CT chest.  Findings: Patchy airspace disease is present in the right midlung compatible with pneumonia.  On the lateral view, this appears to be in the superior segment of the right lower lobe.  Cardiopericardial silhouette appears within normal limits.  Trachea midline.  Follow-up to ensure radiographic clearing recommended.  Clearing is usually observed at 8 weeks.  IMPRESSION: Patchy airspace disease in the superior segment of the right lower lobe compatible with pneumonia.  Original Report Authenticated By: Andreas Newport, M.D.       MDM  Nursing notes including past medical history and social history reviewed and considered in documentation xrays reviewed and considered Previous records reviewed and considered - previous echo negative in records     Date: 01/08/2012  Rate: 63  Rhythm: normal sinus rhythm  QRS Axis: normal  Intervals: normal  ST/T Wave abnormalities: nonspecific ST changes  Conduction Disutrbances:none  Narrative Interpretation:   Old EKG Reviewed: unchanged     I personally performed the services described in this documentation, which was scribed in my presence. The recorded information has been reviewed and considered.      Joya Gaskins, MD 01/08/12 [phone]

## 2012-01-08 NOTE — Discharge Instructions (Signed)

## 2012-01-08 NOTE — ED Notes (Signed)
Sick since Monday w/ sob, sore throat--was seen at urgent care-started on antibiotics, not getting any better, sob worse and head feels "tight"

## 2012-01-29 ENCOUNTER — Encounter (HOSPITAL_COMMUNITY): Payer: Self-pay | Admitting: Emergency Medicine

## 2012-01-29 ENCOUNTER — Emergency Department (HOSPITAL_COMMUNITY)
Admission: EM | Admit: 2012-01-29 | Discharge: 2012-01-29 | Disposition: A | Discharge status: Home / Self Care | Payer: Medicaid Other | Attending: Emergency Medicine | Admitting: Emergency Medicine

## 2012-01-29 ENCOUNTER — Emergency Department (HOSPITAL_COMMUNITY): Payer: Medicaid Other

## 2012-01-29 DIAGNOSIS — R0602 Shortness of breath: Secondary | ICD-10-CM

## 2012-01-29 DIAGNOSIS — R0789 Other chest pain: Secondary | ICD-10-CM | POA: Insufficient documentation

## 2012-01-29 DIAGNOSIS — R05 Cough: Secondary | ICD-10-CM

## 2012-01-29 DIAGNOSIS — R059 Cough, unspecified: Secondary | ICD-10-CM

## 2012-01-29 HISTORY — DX: Pneumonia, unspecified organism: J18.9

## 2012-01-29 LAB — POCT PREGNANCY, URINE: Preg Test, Ur: NEGATIVE

## 2012-01-29 MED ORDER — PREDNISONE 10 MG PO TABS: 20.0000 mg | ORAL_TABLET | Freq: Every day | ORAL | Status: DC

## 2012-01-29 MED ORDER — PREDNISONE 20 MG PO TABS
60.0000 mg | ORAL_TABLET | Freq: Once | ORAL | Status: AC
  Administered 2012-01-29: 60 mg via ORAL
  Filled 2012-01-29: qty 3

## 2012-01-29 MED ORDER — ALBUTEROL SULFATE HFA 108 (90 BASE) MCG/ACT IN AERS: 1.0000 | INHALATION_SPRAY | Freq: Four times a day (QID) | RESPIRATORY_TRACT | Status: DC | PRN

## 2012-01-29 NOTE — ED Notes (Signed)
States was treated for pneumonia about 2 weeks ago, states she took a course of antibiotics and was better.  States that she has been feeling short of breath and coughing up pink tinged phlegm for about 4 days.

## 2012-01-29 NOTE — ED Provider Notes (Signed)
History     CSN: [id number]  Arrival date & time 01/29/12  1027   First MD Initiated Contact with Patient 01/29/12 0046      Chief Complaint  Patient presents with  . Shortness of Breath    (Consider location/radiation/quality/duration/timing/severity/associated sxs/prior treatment) HPI  Elizabeth Horton is a 26 y.o. female who presents to the Emergency Department complaining of cough, shortness of breath and pink tinged sputum that began 4 days ago. She had recently been treated (01/08/2012) for pneumonia. She had been using albuterol inhaler and had stopped. Restarted using albuterol today when shortness of breath began again. It has been associated with some chest tightness. Denies fever, chills, nausea, vomiting.  Past Medical History  Diagnosis Date  . No pertinent past medical history   . Pneumonia     Past Surgical History  Procedure Date  . No past surgeries     Family History  Problem Relation Age of Onset  . Lung cancer Paternal Grandfather     39s  . Breast cancer Paternal Grandmother     73s  . Breast cancer Paternal Aunt   . Colon cancer Father     age 62 diagnosed, succumbed at age 67  . Hypertension Mother   . Thyroid disease Mother   . Colon cancer Sister     Three tumors found at time of colonoscopy. age 70.  . Breast cancer Other     aunt    History  Substance Use Topics  . Smoking status: Never Smoker   . Smokeless tobacco: Never Used  . Alcohol Use: No    OB History    Grav Para Term Preterm Abortions TAB SAB Ect Mult Living   1 1 1  0 0 0 0 0 0 1      Review of Systems  Constitutional: Negative for fever.       10 Systems reviewed and are negative for acute change except as noted in the HPI.  HENT: Negative for congestion.   Eyes: Negative for discharge and redness.  Respiratory: Positive for cough and shortness of breath.   Cardiovascular: Negative for chest pain.  Gastrointestinal: Negative for vomiting and abdominal pain.    Musculoskeletal: Negative for back pain.  Skin: Negative for rash.  Neurological: Negative for syncope, numbness and headaches.  Psychiatric/Behavioral:       No behavior change.    Allergies  Review of patient's allergies indicates no known allergies.  Home Medications   Current Outpatient Rx  Name Route Sig Dispense Refill  . AZITHROMYCIN 250 MG PO TABS  One tablet PO daily for 4 days 4 tablet 0  . CEFPROZIL 500 MG PO TABS Oral Take 500 mg by mouth 2 (two) times daily. Unsure of name but thinks this is med    . PRENATAL PLUS 27-1 MG PO TABS Oral Take 1 tablet by mouth daily. 30 each 5  . PRENATAL PLUS 27-1 MG PO TABS Oral Take 1 tablet by mouth daily.      BP 122/79  Pulse 79  Temp 98 F (36.7 C) (Oral)  Resp 14  Ht 5\' 4"  (1.626 m)  Wt 165 lb (74.844 kg)  BMI 28.32 kg/m2  SpO2 99%  Breastfeeding? Yes  Physical Exam  Nursing note and vitals reviewed. Constitutional: She is oriented to person, place, and time.       Awake, alert, nontoxic appearance.  HENT:  Head: Atraumatic.  Eyes: Conjunctivae and EOM are normal. Pupils are equal, round, and reactive to  light. Right eye exhibits no discharge. Left eye exhibits no discharge.  Neck: Neck supple.  Cardiovascular: Normal heart sounds.   Pulmonary/Chest: Effort normal and breath sounds normal. She has no wheezes. She exhibits no tenderness.  Abdominal: Soft. There is no tenderness. There is no rebound.  Musculoskeletal: She exhibits no tenderness.       Baseline ROM, no obvious new focal weakness.  Neurological: She is alert and oriented to person, place, and time.       Mental status and motor strength appears baseline for patient and situation.  Skin: No rash noted.  Psychiatric: She has a normal mood and affect.    ED Course  Procedures (including critical care time)  Results for orders placed during the hospital encounter of 01/29/12  POCT PREGNANCY, URINE      Component Value Range   Preg Test, Ur NEGATIVE   NEGATIVE  Dg Chest 2 View  01/29/2012  *RADIOLOGY REPORT*  Clinical Data: Cough and shortness of breath.  CHEST - 2 VIEW  Comparison: Chest radiograph performed 01/08/2012  Findings: The lungs are well-aerated and clear.  There is no evidence of focal opacification, pleural effusion or pneumothorax.  The heart is normal in size; the mediastinal contour is within normal limits.  No acute osseous abnormalities are seen.  IMPRESSION: No acute cardiopulmonary process seen.  Original Report Authenticated By: Tonia Ghent, M.D.    MDM  Patient recently treated for pneumonia who has developed shortness of breath and cough.Initiated steroid theapy. Chest xray without acute findings. Pt stable in ED with no significant deterioration in condition.The patient appears reasonably screened and/or stabilized for discharge and I doubt any other medical condition or other [facility] requiring further screening, evaluation, or treatment in the ED at this time prior to discharge.  MDM Reviewed: nursing note and vitals Interpretation: x-ray and labs           [facility]. Colon Branch, MD 01/29/12 [phone]

## 2012-01-29 NOTE — ED Notes (Signed)
Patient discharged to home. Self care. No acute distress and voices no complaints at this time.

## 2012-04-30 ENCOUNTER — Encounter (HOSPITAL_COMMUNITY): Payer: Self-pay | Admitting: Emergency Medicine

## 2012-04-30 ENCOUNTER — Emergency Department (HOSPITAL_COMMUNITY)
Admission: EM | Admit: 2012-04-30 | Discharge: 2012-04-30 | Disposition: A | Discharge status: Home / Self Care | Payer: Medicaid Other | Attending: Emergency Medicine | Admitting: Emergency Medicine

## 2012-04-30 DIAGNOSIS — H81399 Other peripheral vertigo, unspecified ear: Secondary | ICD-10-CM | POA: Insufficient documentation

## 2012-04-30 LAB — CBC WITH DIFFERENTIAL/PLATELET
Basophils Absolute: 0 10*3/uL (ref 0.0–0.1)
Basophils Relative: 1 % (ref 0–1)
Eosinophils Absolute: 0.1 10*3/uL (ref 0.0–0.7)
Eosinophils Relative: 1 % (ref 0–5)
HCT: 37.6 % (ref 36.0–46.0)
Hemoglobin: 12.5 g/dL (ref 12.0–15.0)
Lymphocytes Relative: 42 % (ref 12–46)
Lymphs Abs: 2.8 10*3/uL (ref 0.7–4.0)
MCH: 28.2 pg (ref 26.0–34.0)
MCHC: 33.2 g/dL (ref 30.0–36.0)
MCV: 84.7 fL (ref 78.0–100.0)
Monocytes Absolute: 0.4 10*3/uL (ref 0.1–1.0)
Monocytes Relative: 6 % (ref 3–12)
Neutro Abs: 3.3 10*3/uL (ref 1.7–7.7)
Neutrophils Relative %: 50 % (ref 43–77)
Platelets: 376 10*3/uL (ref 150–400)
RBC: 4.44 MIL/uL (ref 3.87–5.11)
RDW: 13.8 % (ref 11.5–15.5)
WBC: 6.6 10*3/uL (ref 4.0–10.5)

## 2012-04-30 LAB — BASIC METABOLIC PANEL
BUN: 8 mg/dL (ref 6–23)
CO2: 27 mEq/L (ref 19–32)
Calcium: 9.4 mg/dL (ref 8.4–10.5)
Chloride: 105 mEq/L (ref 96–112)
Creatinine, Ser: 0.81 mg/dL (ref 0.50–1.10)
GFR calc Af Amer: >90 mL/min (ref >90)
GFR calc non Af Amer: >90 mL/min (ref >90)
Glucose, Bld: 98 mg/dL (ref 70–99)
Potassium: 3.8 mEq/L (ref 3.5–5.1)
Sodium: 140 mEq/L (ref 135–145)

## 2012-04-30 LAB — POCT PREGNANCY, URINE: Preg Test, Ur: NEGATIVE

## 2012-04-30 MED ORDER — MECLIZINE HCL 12.5 MG PO TABS
25.0000 mg | ORAL_TABLET | Freq: Once | ORAL | Status: AC
  Administered 2012-04-30: 25 mg via ORAL
  Filled 2012-04-30: qty 2

## 2012-04-30 MED ORDER — MECLIZINE HCL 25 MG PO TABS: 25.0000 mg | ORAL_TABLET | Freq: Three times a day (TID) | ORAL | Status: DC | PRN

## 2012-04-30 MED ORDER — ONDANSETRON 8 MG PO TBDP
8.0000 mg | ORAL_TABLET | Freq: Once | ORAL | Status: AC
  Administered 2012-04-30: 8 mg via ORAL
  Filled 2012-04-30: qty 1

## 2012-04-30 NOTE — ED Notes (Signed)
Pt has had dizziness intermittently since Wednesday with n/d.

## 2012-04-30 NOTE — ED Provider Notes (Signed)
History    This chart was scribed for Dione Booze, MD, MD by Smitty Pluck. The patient was seen in room APA11 and the patient's care was started at 10:54AM.   CSN: [id number]  Arrival date & time 04/30/12  1005        Chief Complaint  Patient presents with  . Diarrhea  . Dizziness  . Nausea    (Consider location/radiation/quality/duration/timing/severity/associated sxs/prior treatment) Patient is a 26 y.o. female presenting with diarrhea. The history is provided by the patient. No language interpreter was used.  Diarrhea The primary symptoms include nausea and diarrhea.   Elizabeth Horton is a 26 y.o. female who presents to the Emergency Department complaining of intermittent dizziness and diarrhea onset 2 days ago. Pt reports that dizziness is aggravated by movement. She reports that she had abdominal pain 2 days ago during onset but symptoms have improved. Denies auditory problems, trouble walking and being off balance. She reports having sore throat 1 week ago and pneumonia 2 months ago. Denies any taking medication PTA.Marland Kitchen   Past Medical History  Diagnosis Date  . No pertinent past medical history   . Pneumonia     Past Surgical History  Procedure Date  . No past surgeries     Family History  Problem Relation Age of Onset  . Lung cancer Paternal Grandfather     40s  . Breast cancer Paternal Grandmother     24s  . Breast cancer Paternal Aunt   . Colon cancer Father     age 47 diagnosed, succumbed at age 73  . Hypertension Mother   . Thyroid disease Mother   . Colon cancer Sister     Three tumors found at time of colonoscopy. age 60.  . Breast cancer Other     aunt    History  Substance Use Topics  . Smoking status: Never Smoker   . Smokeless tobacco: Never Used  . Alcohol Use: No    OB History    Grav Para Term Preterm Abortions TAB SAB Ect Mult Living   1 1 1  0 0 0 0 0 0 1      Review of Systems  Gastrointestinal: Positive for nausea and  diarrhea.  Neurological: Positive for dizziness.  All other systems reviewed and are negative.    Allergies  Review of patient's allergies indicates no known allergies.  Home Medications   Current Outpatient Rx  Name Route Sig Dispense Refill  . ALBUTEROL SULFATE HFA 108 (90 BASE) MCG/ACT IN AERS Inhalation Inhale 1-2 puffs into the lungs every 6 (six) hours as needed.    Marland Kitchen PRENATAL PLUS 27-1 MG PO TABS Oral Take 1 tablet by mouth daily. 30 each 5    BP 122/71  Pulse 81  Temp 98.5 F (36.9 C) (Oral)  Resp 18  Ht 5\' 3"  (1.6 m)  Wt 167 lb (75.751 kg)  BMI 29.58 kg/m2  SpO2 99%  LMP 04/29/2012  Physical Exam  Nursing note and vitals reviewed. Constitutional: She is oriented to person, place, and time. She appears well-developed and well-nourished. No distress.  HENT:  Head: Normocephalic and atraumatic.       Mild tonsillar hypertrophy  Eyes: EOM are normal. Pupils are equal, round, and reactive to light.  Neck: Normal range of motion. Neck supple. No tracheal deviation present.  Cardiovascular: Normal rate.   Pulmonary/Chest: Effort normal. No respiratory distress.  Abdominal: Soft. She exhibits no distension.  Musculoskeletal: Normal range of motion.  Neurological: She is alert  and oriented to person, place, and time.       No nystagmus Dizziness reproduced by head movement   Skin: Skin is warm and dry.  Psychiatric: She has a normal mood and affect. Her behavior is normal.    ED Course  Procedures (including critical care time) DIAGNOSTIC STUDIES: Oxygen Saturation is 99% on room air, normal by my interpretation.    COORDINATION OF CARE: 10:58 AM Discussed ED treatment with pt  11:13 AM Ordered:  Medications    ondansetron (ZOFRAN-ODT) disintegrating tablet 8 mg   meclizine (ANTIVERT) tablet 25 mg       Results for orders placed during the hospital encounter of 04/30/12  CBC WITH DIFFERENTIAL      Component Value Range   WBC 6.6  4.0 - 10.5 K/uL    RBC 4.44  3.87 - 5.11 MIL/uL   Hemoglobin 12.5  12.0 - 15.0 g/dL   HCT 54.0  98.1 - 19.1 %   MCV 84.7  78.0 - 100.0 fL   MCH 28.2  26.0 - 34.0 pg   MCHC 33.2  30.0 - 36.0 g/dL   RDW 47.8  29.5 - 62.1 %   Platelets 376  150 - 400 K/uL   Neutrophils Relative 50  43 - 77 %   Neutro Abs 3.3  1.7 - 7.7 K/uL   Lymphocytes Relative 42  12 - 46 %   Lymphs Abs 2.8  0.7 - 4.0 K/uL   Monocytes Relative 6  3 - 12 %   Monocytes Absolute 0.4  0.1 - 1.0 K/uL   Eosinophils Relative 1  0 - 5 %   Eosinophils Absolute 0.1  0.0 - 0.7 K/uL   Basophils Relative 1  0 - 1 %   Basophils Absolute 0.0  0.0 - 0.1 K/uL  BASIC METABOLIC PANEL      Component Value Range   Sodium 140  135 - 145 mEq/L   Potassium 3.8  3.5 - 5.1 mEq/L   Chloride 105  96 - 112 mEq/L   CO2 27  19 - 32 mEq/L   Glucose, Bld 98  70 - 99 mg/dL   BUN 8  6 - 23 mg/dL   Creatinine, Ser 3.08  0.50 - 1.10 mg/dL   Calcium 9.4  8.4 - 65.7 mg/dL   GFR calc non Af Amer >90  >90 mL/min   GFR calc Af Amer >90  >90 mL/min  POCT PREGNANCY, URINE      Component Value Range   Preg Test, Ur NEGATIVE  NEGATIVE    1. Peripheral vertigo       MDM  Dizziness which seemed to be peripheral vertigo. She'll be given a therapeutic trial of meclizine.  She got good relief of dizziness with meclizine. Your be sent home with a prescription for same.  I personally performed the services described in this documentation, which was scribed in my presence. The recorded information has been reviewed and considered.          Dione Booze, MD 04/30/12 [phone]

## 2012-07-21 HISTORY — PX: MOUTH SURGERY: SHX715

## 2012-11-11 ENCOUNTER — Other Ambulatory Visit: Payer: Self-pay | Admitting: Obstetrics & Gynecology

## 2012-11-11 DIAGNOSIS — O3680X0 Pregnancy with inconclusive fetal viability, not applicable or unspecified: Secondary | ICD-10-CM

## 2012-11-17 ENCOUNTER — Other Ambulatory Visit: Payer: Self-pay

## 2012-11-29 ENCOUNTER — Encounter: Payer: Self-pay | Admitting: *Deleted

## 2012-11-30 ENCOUNTER — Other Ambulatory Visit: Payer: Self-pay | Admitting: Obstetrics & Gynecology

## 2012-11-30 ENCOUNTER — Ambulatory Visit (INDEPENDENT_AMBULATORY_CARE_PROVIDER_SITE_OTHER): Payer: Medicaid Other

## 2012-11-30 DIAGNOSIS — O26849 Uterine size-date discrepancy, unspecified trimester: Secondary | ICD-10-CM

## 2012-11-30 DIAGNOSIS — O3680X0 Pregnancy with inconclusive fetal viability, not applicable or unspecified: Secondary | ICD-10-CM

## 2012-11-30 NOTE — Progress Notes (Signed)
U/S-single IUP with +FCA noted FHR=182BPM, CRL c/w 8+0wks, EDD 07/12/2013, cx long and closed, bilateral adnexa wnl

## 2012-12-02 LAB — US OB COMP LESS 14 WKS

## 2012-12-20 ENCOUNTER — Ambulatory Visit (INDEPENDENT_AMBULATORY_CARE_PROVIDER_SITE_OTHER): Payer: Medicaid Other | Admitting: Women's Health

## 2012-12-20 ENCOUNTER — Other Ambulatory Visit (HOSPITAL_COMMUNITY)
Admission: RE | Admit: 2012-12-20 | Discharge: 2012-12-20 | Disposition: A | Discharge status: Home / Self Care | Payer: Medicaid Other | Source: Ambulatory Visit | Attending: Obstetrics & Gynecology | Admitting: Obstetrics & Gynecology

## 2012-12-20 ENCOUNTER — Encounter: Payer: Self-pay | Admitting: Women's Health

## 2012-12-20 ENCOUNTER — Encounter: Payer: Medicaid Other | Admitting: Women's Health

## 2012-12-20 VITALS — BP 120/80 | Wt 173.0 lb

## 2012-12-20 DIAGNOSIS — Z3480 Encounter for supervision of other normal pregnancy, unspecified trimester: Secondary | ICD-10-CM | POA: Insufficient documentation

## 2012-12-20 DIAGNOSIS — Z113 Encounter for screening for infections with a predominantly sexual mode of transmission: Secondary | ICD-10-CM | POA: Insufficient documentation

## 2012-12-20 DIAGNOSIS — O09291 Supervision of pregnancy with other poor reproductive or obstetric history, first trimester: Secondary | ICD-10-CM

## 2012-12-20 DIAGNOSIS — Z3481 Encounter for supervision of other normal pregnancy, first trimester: Secondary | ICD-10-CM

## 2012-12-20 DIAGNOSIS — B373 Candidiasis of vulva and vagina: Secondary | ICD-10-CM

## 2012-12-20 DIAGNOSIS — Z331 Pregnant state, incidental: Secondary | ICD-10-CM

## 2012-12-20 DIAGNOSIS — O09299 Supervision of pregnancy with other poor reproductive or obstetric history, unspecified trimester: Secondary | ICD-10-CM | POA: Insufficient documentation

## 2012-12-20 DIAGNOSIS — Z01419 Encounter for gynecological examination (general) (routine) without abnormal findings: Secondary | ICD-10-CM | POA: Insufficient documentation

## 2012-12-20 DIAGNOSIS — Z1389 Encounter for screening for other disorder: Secondary | ICD-10-CM

## 2012-12-20 LAB — POCT URINALYSIS DIPSTICK
Blood, UA: NEGATIVE
Glucose, UA: NEGATIVE
Ketones, UA: NEGATIVE
Leukocytes, UA: NEGATIVE
Nitrite, UA: NEGATIVE
Protein, UA: NEGATIVE

## 2012-12-20 MED ORDER — FLUCONAZOLE 150 MG PO TABS: 150.0000 mg | ORAL_TABLET | Freq: Once | ORAL | Status: DC

## 2012-12-20 NOTE — Patient Instructions (Signed)
Nausea & Vomiting  Have saltine crackers or pretzels by your bed and eat a few bites before you raise your head out of bed in the morning  Eat small frequent meals throughout the day instead of large meals  Drink plenty of fluids throughout the day to stay hydrated, just don't drink a lot of fluids with your meals.  This can make your stomach fill up faster making you feel sick  Do not brush your teeth right after you eat  Products with real ginger are good for nausea, like ginger ale and ginger hard candy Make sure it says made with real ginger!  Sucking on sour candy like lemon heads is also good for nausea  If your prenatal vitamins make you nauseated, take them at night so you will sleep through the nausea  If you feel like you need medicine for the nausea & vomiting please let us know  If you are unable to keep any fluids or food down please let us know    Pregnancy - First Trimester During sexual intercourse, millions of sperm go into the vagina. Only 1 sperm will penetrate and fertilize the female egg while it is in the Fallopian tube. One week later, the fertilized egg implants into the wall of the uterus. An embryo begins to develop into a baby. At 6 to 8 weeks, the eyes and face are formed and the heartbeat can be seen on ultrasound. At the end of 12 weeks (first trimester), all the baby's organs are formed. Now that you are pregnant, you will want to do everything you can to have a healthy baby. Two of the most important things are to get good prenatal care and follow your caregiver's instructions. Prenatal care is all the medical care you receive before the baby's birth. It is given to prevent, find, and treat problems during the pregnancy and childbirth. PRENATAL EXAMS  During prenatal visits, your weight, blood pressure, and urine are checked. This is done to make sure you are healthy and progressing normally during the pregnancy.  A pregnant woman should gain 25 to 35 pounds  during the pregnancy. However, if you are overweight or underweight, your caregiver will advise you regarding your weight.  Your caregiver will ask and answer questions for you.  Blood work, cervical cultures, other necessary tests, and a Pap test are done during your prenatal exams. These tests are done to check on your health and the probable health of your baby. Tests are strongly recommended and done for HIV with your permission. This is the virus that causes AIDS. These tests are done because medicines can be given to help prevent your baby from being born with this infection should you have been infected without knowing it. Blood work is also used to find out your blood type, previous infections, and follow your blood levels (hemoglobin).  Low hemoglobin (anemia) is common during pregnancy. Iron and vitamins are given to help prevent this. Later in the pregnancy, blood tests for diabetes will be done along with any other tests if any problems develop.  You may need other tests to make sure you and the baby are doing well. CHANGES DURING THE FIRST TRIMESTER  Your body goes through many changes during pregnancy. They vary from person to person. Talk to your caregiver about changes you notice and are concerned about. Changes can include:  Your menstrual period stops.  The egg and sperm carry the genes that determine what you look like. Genes from you   and your partner are forming a baby. The female genes determine whether the baby is a boy or a girl.  Your body increases in girth and you may feel bloated.  Feeling sick to your stomach (nauseous) and throwing up (vomiting). If the vomiting is uncontrollable, call your caregiver.  Your breasts will begin to enlarge and become tender.  Your nipples may stick out more and become darker.  The need to urinate more. Painful urination may mean you have a bladder infection.  Tiring easily.  Loss of appetite.  Cravings for certain kinds of  food.  At first, you may gain or lose a couple of pounds.  You may have changes in your emotions from day to day (excited to be pregnant or concerned something may go wrong with the pregnancy and baby).  You may have more vivid and strange dreams. HOME CARE INSTRUCTIONS   It is very important to avoid all smoking, alcohol and non-prescribed drugs during your pregnancy. These affect the formation and growth of the baby. Avoid chemicals while pregnant to ensure the delivery of a healthy infant.  Start your prenatal visits by the 12th week of pregnancy. They are usually scheduled monthly at first, then more often in the last 2 months before delivery. Keep your caregiver's appointments. Follow your caregiver's instructions regarding medicine use, blood and lab tests, exercise, and diet.  During pregnancy, you are providing food for you and your baby. Eat regular, well-balanced meals. Choose foods such as meat, fish, milk and other low fat dairy products, vegetables, fruits, and whole-grain breads and cereals. Your caregiver will tell you of the ideal weight gain.  You can help morning sickness by keeping soda crackers at the bedside. Eat a couple before arising in the morning. You may want to use the crackers without salt on them.  Eating 4 to 5 small meals rather than 3 large meals a day also may help the nausea and vomiting.  Drinking liquids between meals instead of during meals also seems to help nausea and vomiting.  A physical sexual relationship may be continued throughout pregnancy if there are no other problems. Problems may be early (premature) leaking of amniotic fluid from the membranes, vaginal bleeding, or belly (abdominal) pain.  Exercise regularly if there are no restrictions. Check with your caregiver or physical therapist if you are unsure of the safety of some of your exercises. Greater weight gain will occur in the last 2 trimesters of pregnancy. Exercising will  help:  Control your weight.  Keep you in shape.  Prepare you for labor and delivery.  Help you lose your pregnancy weight after you deliver your baby.  Wear a good support or jogging bra for breast tenderness during pregnancy. This may help if worn during sleep too.  Ask when prenatal classes are available. Begin classes when they are offered.  Do not use hot tubs, steam rooms, or saunas.  Wear your seat belt when driving. This protects you and your baby if you are in an accident.  Avoid raw meat, uncooked cheese, cat litter boxes, and soil used by cats throughout the pregnancy. These carry germs that can cause birth defects in the baby.  The first trimester is a good time to visit your dentist for your dental health. Getting your teeth cleaned is okay. Use a softer toothbrush and brush gently during pregnancy.  Ask for help if you have financial, counseling, or nutritional needs during pregnancy. Your caregiver will be able to offer counseling for   these needs as well as refer you for other special needs.  Do not take any medicines or herbs unless told by your caregiver.  Inform your caregiver if there is any mental or physical domestic violence.  Make a list of emergency phone numbers of family, friends, hospital, and police and fire departments.  Write down your questions. Take them to your prenatal visit.  Do not douche.  Do not cross your legs.  If you have to stand for long periods of time, rotate you feet or take small steps in a circle.  You may have more vaginal secretions that may require a sanitary pad. Do not use tampons or scented sanitary pads. MEDICINES AND DRUG USE IN PREGNANCY  Take prenatal vitamins as directed. The vitamin should contain 1 milligram of folic acid. Keep all vitamins out of reach of children. Only a couple vitamins or tablets containing iron may be fatal to a baby or young child when ingested.  Avoid use of all medicines, including herbs,  over-the-counter medicines, not prescribed or suggested by your caregiver. Only take over-the-counter or prescription medicines for pain, discomfort, or fever as directed by your caregiver. Do not use aspirin, ibuprofen, or naproxen unless directed by your caregiver.  Let your caregiver also know about herbs you may be using.  Alcohol is related to a number of birth defects. This includes fetal alcohol syndrome. All alcohol, in any form, should be avoided completely. Smoking will cause low birth rate and premature babies.  Street or illegal drugs are very harmful to the baby. They are absolutely forbidden. A baby born to an addicted mother will be addicted at birth. The baby will go through the same withdrawal an adult does.  Let your caregiver know about any medicines that you have to take and for what reason you take them. SEEK MEDICAL CARE IF:  You have any concerns or worries during your pregnancy. It is better to call with your questions if you feel they cannot wait, rather than worry about them. SEEK IMMEDIATE MEDICAL CARE IF:   An unexplained oral temperature above 102 F (38.9 C) develops, or as your caregiver suggests.  You have leaking of fluid from the vagina (birth canal). If leaking membranes are suspected, take your temperature and inform your caregiver of this when you call.  There is vaginal spotting or bleeding. Notify your caregiver of the amount and how many pads are used.  You develop a bad smelling vaginal discharge with a change in the color.  You continue to feel sick to your stomach (nauseated) and have no relief from remedies suggested. You vomit blood or coffee ground-like materials.  You lose more than 2 pounds of weight in 1 week.  You gain more than 2 pounds of weight in 1 week and you notice swelling of your face, hands, feet, or legs.  You gain 5 pounds or more in 1 week (even if you do not have swelling of your hands, face, legs, or feet).  You get  exposed to German measles and have never had them.  You are exposed to fifth disease or chickenpox.  You develop belly (abdominal) pain. Round ligament discomfort is a common non-cancerous (benign) cause of abdominal pain in pregnancy. Your caregiver still must evaluate this.  You develop headache, fever, diarrhea, pain with urination, or shortness of breath.  You fall or are in a car accident or have any kind of trauma.  There is mental or physical violence in your home. Document   Released: 07/01/2001 Document Revised: 03/31/2012 Document Reviewed: 01/02/2009 [employer] Patient Information 2014 [employer].  

## 2012-12-20 NOTE — Progress Notes (Signed)
Subjective:    Elizabeth Horton is a 27 y.o. G44P1001 African American female at [redacted]w[redacted]d by 8wk u/s, being seen today for her first obstetrical visit.  Her obstetrical history is significant for 1.10min shoulder dystocia w/ birth of 7lb 10.8oz baby. w/ involvement of left arm that had resolved by the next day.  Pregnancy history fully reviewed. Interested in water birth.  Patient reports nausea, but no vomiting. Declines antiemetics at this time.  No cramping or bleeding.  Filed Vitals:   12/20/12 1443  BP: 120/80  Weight: 173 lb (78.472 kg)    HISTORY: OB History   Grav Para Term Preterm Abortions TAB SAB Ect Mult Living   2 1 1  0 0 0 0 0 0 1     # Outc Date GA Lbr Len/2nd Wgt Sex Del Anes PTL Lv   1 TRM 11/12 [redacted]w[redacted]d 27:30 / 01:14 7lb10.6oz(3.475kg) M SVD EPI  Yes   Comments: no movement of L arm, subcostal and substernal reractions, tachycardic, poor perfusion   2 CUR              Past Medical History  Diagnosis Date  . No pertinent past medical history   . Pneumonia   . Abnormal pap    Past Surgical History  Procedure Laterality Date  . Leep    . Mouth surgery  2014    wisdom teeth removed   Family History  Problem Relation Age of Onset  . Lung cancer Paternal Grandfather     68s  . Breast cancer Paternal Grandmother     67s  . Breast cancer Paternal Aunt   . Colon cancer Father     age 24 diagnosed, succumbed at age 67  . Hypertension Mother   . Thyroid disease Mother   . Colon cancer Sister     Three tumors found at time of colonoscopy. age 49.  . Breast cancer Other     aunt  . Diabetes Maternal Grandmother   . Diabetes Maternal Grandfather      Exam    Pelvic Exam:    Perineum: Normal Perineum   Vulva: normal   Vagina:  normal mucosa, no palpable nodules,    Uterus        Cervix: App 1cm cervical polyp in center of external os   Adnexa: Not palpable   Urinary: urethral meatus normal    System:     Skin: normal coloration and turgor, no  rashes    Neurologic: oriented, normal mood   Extremities: normal strength, tone, and muscle mass   HEENT PERRLA   Mouth/Teeth mucous membranes moist   Cardiovascular: regular rate and rhythm   Respiratory:  appears well, vitals normal, no respiratory distress, acyanotic, normal RR   Abdomen: soft, non-tender    Co-exam of cervix w/ LHE who states cervical polyp as result of granulation tissue from LEEP Thin prep pap smear obtained  FHR 140 via informal transabdominal u/s   Assessment:    Pregnancy: G2P1001 Patient Active Problem List   Diagnosis Date Noted  . Supervision of other normal pregnancy 12/20/2012    Priority: High  . Rectal bleed 11/19/2011  . FH: colon cancer 11/19/2011  . FH: colon cancer 11/19/2011  . Rectal bleed 11/19/2011  . Tachycardia 07/24/2011  . Dyspnea 07/24/2011  . Supervision of normal pregnancy 06/21/2011      [redacted]w[redacted]d G2P1001  New OB visit Vaginal candida Cervical polyp    Plan:   Initial labs drawn Continue prenatal vitamins Problem  list reviewed and updated Reviewed n/v relief measures and warning s/s to report Genetic Screening discussed Integrated Screen: declined Cystic fibrosis screening discussed declined Ultrasound discussed; fetal survey: requested Follow up in 4 weeks [facility] form completed and faxed  Marge Duncans 12/20/2012 3:07 PM

## 2012-12-20 NOTE — Progress Notes (Signed)
crampy feeling. Has a lot more mucus in throat. New OB packet given. Consents signed. JSY

## 2012-12-21 LAB — CBC
HCT: 36.4 % (ref 36.0–46.0)
Hemoglobin: 12.2 g/dL (ref 12.0–15.0)
MCH: 27.1 pg (ref 26.0–34.0)
MCHC: 33.5 g/dL (ref 30.0–36.0)
MCV: 80.9 fL (ref 78.0–100.0)
Platelets: 358 10*3/uL (ref 150–400)
RBC: 4.5 MIL/uL (ref 3.87–5.11)
RDW: 14.3 % (ref 11.5–15.5)
WBC: 7.8 10*3/uL (ref 4.0–10.5)

## 2012-12-21 LAB — DRUG SCREEN, URINE, NO CONFIRMATION
Amphetamine Screen, Ur: NEGATIVE
Barbiturate Quant, Ur: NEGATIVE
Benzodiazepines.: NEGATIVE
Cocaine Metabolites: NEGATIVE
Creatinine,U: 219.5 mg/dL
Marijuana Metabolite: NEGATIVE
Methadone: NEGATIVE
Opiate Screen, Urine: NEGATIVE
Phencyclidine (PCP): NEGATIVE
Propoxyphene: NEGATIVE

## 2012-12-21 LAB — URINALYSIS
Bilirubin Urine: NEGATIVE
Glucose, UA: NEGATIVE mg/dL
Hgb urine dipstick: NEGATIVE
Ketones, ur: NEGATIVE mg/dL
Leukocytes, UA: NEGATIVE
Nitrite: NEGATIVE
Protein, ur: NEGATIVE mg/dL
Specific Gravity, Urine: 1.018 (ref 1.005–1.030)
Urobilinogen, UA: 0.2 mg/dL (ref 0.0–1.0)
pH: 6.5 (ref 5.0–8.0)

## 2012-12-21 LAB — ABO AND RH: Rh Type: POSITIVE

## 2012-12-21 LAB — HEPATITIS B SURFACE ANTIGEN: Hepatitis B Surface Ag: NEGATIVE

## 2012-12-21 LAB — RPR

## 2012-12-21 LAB — ANTIBODY SCREEN: Antibody Screen: NEGATIVE

## 2012-12-21 LAB — OXYCODONE SCREEN, UA, RFLX CONFIRM: Oxycodone Screen, Ur: NEGATIVE ng/mL

## 2012-12-21 LAB — VARICELLA ZOSTER ANTIBODY, IGG: Varicella IgG: 2524 Index — ABNORMAL HIGH (ref <135.00)

## 2012-12-21 LAB — HIV ANTIBODY (ROUTINE TESTING W REFLEX): HIV: NONREACTIVE

## 2012-12-21 LAB — RUBELLA SCREEN: Rubella: 4.45 Index — ABNORMAL HIGH (ref <0.90)

## 2012-12-22 LAB — URINE CULTURE
Colony Count: NO GROWTH
Organism ID, Bacteria: NO GROWTH

## 2012-12-24 ENCOUNTER — Telehealth: Payer: Self-pay | Admitting: *Deleted

## 2012-12-24 NOTE — Telephone Encounter (Signed)
Left message. JSY 

## 2012-12-26 ENCOUNTER — Encounter: Payer: Self-pay | Admitting: Women's Health

## 2012-12-28 NOTE — Telephone Encounter (Signed)
Spoke with pt. Pap smear was normal. JSY

## 2013-01-14 ENCOUNTER — Encounter (HOSPITAL_COMMUNITY): Payer: Self-pay | Admitting: Emergency Medicine

## 2013-01-14 ENCOUNTER — Emergency Department (HOSPITAL_COMMUNITY)
Admission: EM | Admit: 2013-01-14 | Discharge: 2013-01-15 | Disposition: A | Discharge status: Home / Self Care | Payer: Medicaid Other | Attending: Emergency Medicine | Admitting: Emergency Medicine

## 2013-01-14 DIAGNOSIS — Z8701 Personal history of pneumonia (recurrent): Secondary | ICD-10-CM | POA: Insufficient documentation

## 2013-01-14 DIAGNOSIS — O9989 Other specified diseases and conditions complicating pregnancy, childbirth and the puerperium: Secondary | ICD-10-CM | POA: Insufficient documentation

## 2013-01-14 DIAGNOSIS — R109 Unspecified abdominal pain: Secondary | ICD-10-CM | POA: Insufficient documentation

## 2013-01-14 DIAGNOSIS — N949 Unspecified condition associated with female genital organs and menstrual cycle: Secondary | ICD-10-CM

## 2013-01-14 LAB — URINALYSIS, ROUTINE W REFLEX MICROSCOPIC
Bilirubin Urine: NEGATIVE
Glucose, UA: NEGATIVE mg/dL
Hgb urine dipstick: NEGATIVE
Ketones, ur: NEGATIVE mg/dL
Leukocytes, UA: NEGATIVE
Nitrite: NEGATIVE
Protein, ur: NEGATIVE mg/dL
Specific Gravity, Urine: 1.012 (ref 1.005–1.030)
Urobilinogen, UA: 0.2 mg/dL (ref 0.0–1.0)
pH: 6 (ref 5.0–8.0)

## 2013-01-14 LAB — CBC WITH DIFFERENTIAL/PLATELET
Basophils Absolute: 0 10*3/uL (ref 0.0–0.1)
Basophils Relative: 0 % (ref 0–1)
Eosinophils Absolute: 0.1 10*3/uL (ref 0.0–0.7)
Eosinophils Relative: 1 % (ref 0–5)
HCT: 33.9 % — ABNORMAL LOW (ref 36.0–46.0)
Hemoglobin: 11.8 g/dL — ABNORMAL LOW (ref 12.0–15.0)
Lymphocytes Relative: 34 % (ref 12–46)
Lymphs Abs: 3.6 10*3/uL (ref 0.7–4.0)
MCH: 28.5 pg (ref 26.0–34.0)
MCHC: 34.8 g/dL (ref 30.0–36.0)
MCV: 81.9 fL (ref 78.0–100.0)
Monocytes Absolute: 0.7 10*3/uL (ref 0.1–1.0)
Monocytes Relative: 7 % (ref 3–12)
Neutro Abs: 6 10*3/uL (ref 1.7–7.7)
Neutrophils Relative %: 58 % (ref 43–77)
Platelets: 298 10*3/uL (ref 150–400)
RBC: 4.14 MIL/uL (ref 3.87–5.11)
RDW: 13.5 % (ref 11.5–15.5)
WBC: 10.5 10*3/uL (ref 4.0–10.5)

## 2013-01-14 LAB — POCT PREGNANCY, URINE: Preg Test, Ur: POSITIVE — AB

## 2013-01-14 LAB — COMPREHENSIVE METABOLIC PANEL
ALT: 5 U/L (ref 0–35)
AST: 12 U/L (ref 0–37)
Albumin: 3.2 g/dL — ABNORMAL LOW (ref 3.5–5.2)
Alkaline Phosphatase: 45 U/L (ref 39–117)
BUN: 6 mg/dL (ref 6–23)
CO2: 23 mEq/L (ref 19–32)
Calcium: 8.9 mg/dL (ref 8.4–10.5)
Chloride: 100 mEq/L (ref 96–112)
Creatinine, Ser: 0.7 mg/dL (ref 0.50–1.10)
GFR calc Af Amer: >90 mL/min (ref >90)
GFR calc non Af Amer: >90 mL/min (ref >90)
Glucose, Bld: 98 mg/dL (ref 70–99)
Potassium: 3.6 mEq/L (ref 3.5–5.1)
Sodium: 134 mEq/L — ABNORMAL LOW (ref 135–145)
Total Bilirubin: 0.5 mg/dL (ref 0.3–1.2)
Total Protein: 7.4 g/dL (ref 6.0–8.3)

## 2013-01-14 NOTE — ED Notes (Signed)
PT. REPORTS LOW ABDOMINAL PAIN /CRAMPING ONSET TODAY , DENIES VAGINAL DISCHARGE OR SPOTTING , PT. STATED SHE IS [redacted] WEEKS PREGNANT ( G2P1) , DENIES NAUSEA/VOMITTING OR DIARRHEA . NO FEVER OR CHILLS.

## 2013-01-15 LAB — ABO/RH: ABO/RH(D): B POS

## 2013-01-15 LAB — WET PREP, GENITAL
Trich, Wet Prep: NONE SEEN
Yeast Wet Prep HPF POC: NONE SEEN

## 2013-01-15 LAB — RPR: RPR Ser Ql: NONREACTIVE

## 2013-01-15 LAB — HCG, QUANTITATIVE, PREGNANCY: hCG, Beta Chain, Quant, S: 19551 m[IU]/mL — ABNORMAL HIGH (ref <5)

## 2013-01-15 NOTE — ED Provider Notes (Signed)
History    CSN: [id number] Arrival date & time 01/14/13  2140  First MD Initiated Contact with Patient 01/15/13 0129     Chief Complaint  Patient presents with  . Abdominal Pain   (Consider location/radiation/quality/duration/timing/severity/associated sxs/prior Treatment) Patient is a 27 y.o. female presenting with abdominal pain. The history is provided by the patient.  Abdominal Pain Associated symptoms include abdominal pain.  She has been having lower abdominal pain today. Pain is crampy and she rates it at 5/10. It is worse with standing and better with laying supine. It is also worse just before urinating and better after urinating.  She denies urinary she denies flank pain or back pain. There's been no change in her nausea of pregnancy. She denies vomiting,urgency, frequency, tenesmus, dysuria. She is pregnant with last menstrual period March 6.  Pregnancy  has been uncomplicated. She is gravida 2, para 1. she denies vaginal discharge or bleeding.  Past Medical History  Diagnosis Date  . No pertinent past medical history   . Pneumonia   . Abnormal pap    Past Surgical History  Procedure Laterality Date  . Leep    . Mouth surgery  2014    wisdom teeth removed   Family History  Problem Relation Age of Onset  . Lung cancer Paternal Grandfather     32s  . Breast cancer Paternal Grandmother     33s  . Breast cancer Paternal Aunt   . Colon cancer Father     age 20 diagnosed, succumbed at age 35  . Hypertension Mother   . Thyroid disease Mother   . Colon cancer Sister     Three tumors found at time of colonoscopy. age 82.  . Breast cancer Other     aunt  . Diabetes Maternal Grandmother   . Diabetes Maternal Grandfather    History  Substance Use Topics  . Smoking status: Never Smoker   . Smokeless tobacco: Never Used  . Alcohol Use: No   OB History   Grav Para Term Preterm Abortions TAB SAB Ect Mult Living   2 1 1  0 0 0 0 0 0 1     Review of Systems   Gastrointestinal: Positive for abdominal pain.  All other systems reviewed and are negative.    Allergies  Review of patient's allergies indicates no known allergies.  Home Medications   Current Outpatient Rx  Name  Route  Sig  Dispense  Refill  . prenatal vitamin w/FE, FA (PRENATAL 1 + 1) 27-1 MG TABS   Oral   Take 1 tablet by mouth daily.   30 each   5    BP 129/80  Pulse 104  Temp(Src) 99 F (37.2 C) (Oral)  Resp 18  SpO2 98%  LMP 09/23/2012 Physical Exam  Nursing note and vitals reviewed.  27 year old female, resting comfortably and in no acute distress. Vital signs are  significant for borderline tachycardia with heart rate 104 . Oxygen saturation is 98%, which is normal. Head is normocephalic and atraumatic. PERRLA, EOMI. Oropharynx is clear. Neck is nontender and supple without adenopathy or JVD. Back is nontender and there is no CVA tenderness. Lungs are clear without rales, wheezes, or rhonchi. Chest is nontender. Heart has regular rate and rhythm without murmur. Abdomen is soft, flat, nontender without masses or hepatosplenomegaly and peristalsis is normoactive. gravid uterus is present to about midway between the symphysis pubis and umbilicus consistent with her stated duration of pregnancy. There is no  uterine tenderness.  Extremities have no cyanosis or edema, full range of motion is present. Skin is warm and dry without rash. Neurologic: Mental status is normal, cranial nerves are intact, there are no motor or sensory deficits.  ED Course  Procedures (including critical care time) Results for orders placed during the hospital encounter of 01/14/13  WET PREP, GENITAL      Result Value Range   Yeast Wet Prep HPF POC NONE SEEN  NONE SEEN   Trich, Wet Prep NONE SEEN  NONE SEEN   Clue Cells Wet Prep HPF POC FEW (*) NONE SEEN   WBC, Wet Prep HPF POC FEW (*) NONE SEEN  COMPREHENSIVE METABOLIC PANEL      Result Value Range   Sodium 134 (*) 135 - 145 mEq/L    Potassium 3.6  3.5 - 5.1 mEq/L   Chloride 100  96 - 112 mEq/L   CO2 23  19 - 32 mEq/L   Glucose, Bld 98  70 - 99 mg/dL   BUN 6  6 - 23 mg/dL   Creatinine, Ser 4.09  0.50 - 1.10 mg/dL   Calcium 8.9  8.4 - 81.1 mg/dL   Total Protein 7.4  6.0 - 8.3 g/dL   Albumin 3.2 (*) 3.5 - 5.2 g/dL   AST 12  0 - 37 U/L   ALT 5  0 - 35 U/L   Alkaline Phosphatase 45  39 - 117 U/L   Total Bilirubin 0.5  0.3 - 1.2 mg/dL   GFR calc non Af Amer >90  >90 mL/min   GFR calc Af Amer >90  >90 mL/min  CBC WITH DIFFERENTIAL      Result Value Range   WBC 10.5  4.0 - 10.5 K/uL   RBC 4.14  3.87 - 5.11 MIL/uL   Hemoglobin 11.8 (*) 12.0 - 15.0 g/dL   HCT 91.4 (*) 78.2 - 95.6 %   MCV 81.9  78.0 - 100.0 fL   MCH 28.5  26.0 - 34.0 pg   MCHC 34.8  30.0 - 36.0 g/dL   RDW 21.3  08.6 - 57.8 %   Platelets 298  150 - 400 K/uL   Neutrophils Relative % 58  43 - 77 %   Neutro Abs 6.0  1.7 - 7.7 K/uL   Lymphocytes Relative 34  12 - 46 %   Lymphs Abs 3.6  0.7 - 4.0 K/uL   Monocytes Relative 7  3 - 12 %   Monocytes Absolute 0.7  0.1 - 1.0 K/uL   Eosinophils Relative 1  0 - 5 %   Eosinophils Absolute 0.1  0.0 - 0.7 K/uL   Basophils Relative 0  0 - 1 %   Basophils Absolute 0.0  0.0 - 0.1 K/uL  URINALYSIS, ROUTINE W REFLEX MICROSCOPIC      Result Value Range   Color, Urine YELLOW  YELLOW   APPearance CLEAR  CLEAR   Specific Gravity, Urine 1.012  1.005 - 1.030   pH 6.0  5.0 - 8.0   Glucose, UA NEGATIVE  NEGATIVE mg/dL   Hgb urine dipstick NEGATIVE  NEGATIVE   Bilirubin Urine NEGATIVE  NEGATIVE   Ketones, ur NEGATIVE  NEGATIVE mg/dL   Protein, ur NEGATIVE  NEGATIVE mg/dL   Urobilinogen, UA 0.2  0.0 - 1.0 mg/dL   Nitrite NEGATIVE  NEGATIVE   Leukocytes, UA NEGATIVE  NEGATIVE  HCG, QUANTITATIVE, PREGNANCY      Result Value Range   hCG, Beta Chain, Quant, S 19551 (*) <  5 mIU/mL  POCT PREGNANCY, URINE      Result Value Range   Preg Test, Ur POSITIVE (*) NEGATIVE  ABO/RH      Result Value Range   ABO/RH(D) B POS      No rh immune globuloin NOT A RH IMMUNE GLOBULIN CANDIDATE, PT RH POSITIVE     1. Round ligament pain     MDM  Lower abdominal pain in patient in approximately [redacted] weeks gestation. Clinically, this seems most likely to be due to round ligament pain. Pelvic exam will be done to try and confirm this diagnosis. Laboratory workup has come back showing no evidence of urinary tract infection.  Exam shows a moderate yellowish to whitish discharge present. Bimanual exam shows no adnexal masses or tenderness. Some pain is elicited with any fundus is pushed to the left which would be consistent with round ligament pain. Of note, she states that she has had an ultrasound showing an intrauterine pregnancy.  Dione Booze, MD 01/15/13 [phone]

## 2013-01-16 LAB — GC/CHLAMYDIA PROBE AMP
CT Probe RNA: NEGATIVE
GC Probe RNA: NEGATIVE

## 2013-01-17 ENCOUNTER — Ambulatory Visit (INDEPENDENT_AMBULATORY_CARE_PROVIDER_SITE_OTHER): Payer: Medicaid Other | Admitting: Women's Health

## 2013-01-17 ENCOUNTER — Encounter: Payer: Self-pay | Admitting: Women's Health

## 2013-01-17 VITALS — BP 118/80 | Wt 178.0 lb

## 2013-01-17 DIAGNOSIS — O99019 Anemia complicating pregnancy, unspecified trimester: Secondary | ICD-10-CM

## 2013-01-17 DIAGNOSIS — Z3482 Encounter for supervision of other normal pregnancy, second trimester: Secondary | ICD-10-CM

## 2013-01-17 DIAGNOSIS — Z331 Pregnant state, incidental: Secondary | ICD-10-CM

## 2013-01-17 DIAGNOSIS — Z1389 Encounter for screening for other disorder: Secondary | ICD-10-CM

## 2013-01-17 LAB — POCT URINALYSIS DIPSTICK
Blood, UA: NEGATIVE
Glucose, UA: NEGATIVE
Ketones, UA: NEGATIVE
Leukocytes, UA: NEGATIVE
Nitrite, UA: NEGATIVE
Protein, UA: NEGATIVE

## 2013-01-17 NOTE — Progress Notes (Signed)
PT WENT TO [facility] ON Friday .WAS  TOLD SHE WAS HAVING ROUND LIGAMENT PAIN.

## 2013-01-17 NOTE — Progress Notes (Signed)
Denies uc's, lof, vb, urinary frequency, urgency, hesitancy, or dysuria.  Reports RLP, discussed relief measures.  Reviewed warning s/s to report.  Discussed h/o shoulder dystocia, possibility of happening again, but no way to accurately predict occurrence of shoulder dystocia. Pt still interested in waterbirth, has class info. All questions answered. F/U in 4wks for anatomy u/s and visit.

## 2013-01-17 NOTE — Patient Instructions (Signed)

## 2013-02-10 ENCOUNTER — Telehealth: Payer: Self-pay | Admitting: Obstetrics and Gynecology

## 2013-02-10 NOTE — Telephone Encounter (Signed)
Pt states she is having some cramping on her left side. " Feels like ovulation pain", pain was going down into her leg earlier. Feels a dull pain in her lower left pelvic area. Pt denies any bleeding, some baby movement noted. Spoke wit JVF about above, he advised sounds like round ligament pain. I advised the pt of this and that if she has any bleeding or if the pain gets worse to call us back or go straight to [facility]. Pt verbalized understanding.

## 2013-02-12 ENCOUNTER — Encounter (HOSPITAL_COMMUNITY): Payer: Self-pay

## 2013-02-12 ENCOUNTER — Inpatient Hospital Stay (HOSPITAL_COMMUNITY)
Admission: AD | Admit: 2013-02-12 | Discharge: 2013-02-12 | Disposition: A | Discharge status: Home / Self Care | Payer: Medicaid Other | Source: Ambulatory Visit | Attending: Obstetrics & Gynecology | Admitting: Obstetrics & Gynecology

## 2013-02-12 DIAGNOSIS — T148XXA Other injury of unspecified body region, initial encounter: Secondary | ICD-10-CM

## 2013-02-12 DIAGNOSIS — R109 Unspecified abdominal pain: Secondary | ICD-10-CM | POA: Insufficient documentation

## 2013-02-12 DIAGNOSIS — M549 Dorsalgia, unspecified: Secondary | ICD-10-CM | POA: Insufficient documentation

## 2013-02-12 DIAGNOSIS — K219 Gastro-esophageal reflux disease without esophagitis: Secondary | ICD-10-CM | POA: Insufficient documentation

## 2013-02-12 DIAGNOSIS — R079 Chest pain, unspecified: Secondary | ICD-10-CM | POA: Insufficient documentation

## 2013-02-12 DIAGNOSIS — R197 Diarrhea, unspecified: Secondary | ICD-10-CM | POA: Insufficient documentation

## 2013-02-12 DIAGNOSIS — O99891 Other specified diseases and conditions complicating pregnancy: Secondary | ICD-10-CM | POA: Insufficient documentation

## 2013-02-12 LAB — URINALYSIS, ROUTINE W REFLEX MICROSCOPIC
Bilirubin Urine: NEGATIVE
Glucose, UA: NEGATIVE mg/dL
Hgb urine dipstick: NEGATIVE
Ketones, ur: NEGATIVE mg/dL
Leukocytes, UA: NEGATIVE
Nitrite: NEGATIVE
Protein, ur: NEGATIVE mg/dL
Specific Gravity, Urine: 1.01 (ref 1.005–1.030)
Urobilinogen, UA: 0.2 mg/dL (ref 0.0–1.0)
pH: 6 (ref 5.0–8.0)

## 2013-02-12 MED ORDER — CYCLOBENZAPRINE HCL 10 MG PO TABS: 10.0000 mg | ORAL_TABLET | Freq: Two times a day (BID) | ORAL | Status: DC | PRN

## 2013-02-12 MED ORDER — CALCIUM CARBONATE ANTACID 500 MG PO CHEW
2.0000 | CHEWABLE_TABLET | Freq: Once | ORAL | Status: AC
  Administered 2013-02-12: 400 mg via ORAL
  Filled 2013-02-12: qty 2

## 2013-02-12 MED ORDER — FAMOTIDINE 20 MG PO TABS: 20.0000 mg | ORAL_TABLET | Freq: Two times a day (BID) | ORAL | Status: DC

## 2013-02-12 NOTE — MAU Note (Signed)
Pt sttes tht she is having pain that starts on the left lower side and goes down to her upper thigh-started Fri morning-states has chest pains when she takes a deep breath

## 2013-02-12 NOTE — MAU Provider Note (Signed)
History     CSN: [id number]  Arrival date and time: 02/12/13 1916   First Provider Initiated Contact with Patient 02/12/13 1957      Chief Complaint  Patient presents with  . Back Pain   HPI Ms. Anaalicia Reimann is a 27 y.o. G2P1001 at [redacted]w[redacted]d who presents to MAU today with complaint of left-sided pain since Friday morning. The patient states that she had a prolonged episode of N/V/D on Friday morning prior to onset of pain. She states that she vomited x 15-30 minutes and was having concurrent diarrhea. She has not had any similar episodes since. She feels that this was most likely food related. She states that since then she has had pain down her left side to her hip. Pain is worse with movement and ambulation. She is also having some mild epigastric discomfort near the xyphoid process. Pain is worse with deep breathing. She denies heartburn, fever, UTI symptoms, hematuria, vaginal bleeding, abnormal discharge, LOF.   OB History   Grav Para Term Preterm Abortions TAB SAB Ect Mult Living   2 1 1  0 0 0 0 0 0 1      Past Medical History  Diagnosis Date  . No pertinent past medical history   . Pneumonia   . Abnormal pap     Past Surgical History  Procedure Laterality Date  . Leep    . Mouth surgery  2014    wisdom teeth removed    Family History  Problem Relation Age of Onset  . Lung cancer Paternal Grandfather     25s  . Breast cancer Paternal Grandmother     35s  . Breast cancer Paternal Aunt   . Colon cancer Father     age 54 diagnosed, succumbed at age 44  . Hypertension Mother   . Thyroid disease Mother   . Colon cancer Sister     Three tumors found at time of colonoscopy. age 52.  . Breast cancer Other     aunt  . Diabetes Maternal Grandmother   . Diabetes Maternal Grandfather     History  Substance Use Topics  . Smoking status: Never Smoker   . Smokeless tobacco: Never Used  . Alcohol Use: No    Allergies: No Known Allergies  Prescriptions prior to  admission  Medication Sig Dispense Refill  . prenatal vitamin w/FE, FA (PRENATAL 1 + 1) 27-1 MG TABS Take 1 tablet by mouth daily.  30 each  5    Review of Systems  Constitutional: Negative for fever.  Cardiovascular: Positive for chest pain.  Gastrointestinal: Positive for nausea, vomiting, abdominal pain and diarrhea. Negative for constipation and blood in stool.  Genitourinary: Negative for dysuria, urgency and frequency.       Neg - vaginal bleeding, discharge, LOF  Musculoskeletal: Positive for myalgias.  Neurological: Negative for dizziness and loss of consciousness.   Physical Exam   Blood pressure 111/86, pulse 87, temperature 98.9 F (37.2 C), temperature source Oral, resp. rate 16, height 5' 3.5" (1.613 m), weight 182 lb 2 oz (82.611 kg), last menstrual period 09/23/2012, SpO2 100.00%.  Physical Exam  Constitutional: She is oriented to person, place, and time. She appears well-developed and well-nourished. No distress.  HENT:  Head: Normocephalic and atraumatic.  Cardiovascular: Normal rate.   Respiratory: Effort normal.  GI: Soft.  Neurological: She is alert and oriented to person, place, and time.  Skin: Skin is warm and dry. No erythema.  Psychiatric: She has a normal mood  and affect.   Results for orders placed during the hospital encounter of 02/12/13 (from the past 24 hour(s))  URINALYSIS, ROUTINE W REFLEX MICROSCOPIC     Status: None   Collection Time    02/12/13  7:25 PM      Result Value Range   Color, Urine YELLOW  YELLOW   APPearance CLEAR  CLEAR   Specific Gravity, Urine 1.010  1.005 - 1.030   pH 6.0  5.0 - 8.0   Glucose, UA NEGATIVE  NEGATIVE mg/dL   Hgb urine dipstick NEGATIVE  NEGATIVE   Bilirubin Urine NEGATIVE  NEGATIVE   Ketones, ur NEGATIVE  NEGATIVE mg/dL   Protein, ur NEGATIVE  NEGATIVE mg/dL   Urobilinogen, UA 0.2  0.0 - 1.0 mg/dL   Nitrite NEGATIVE  NEGATIVE   Leukocytes, UA NEGATIVE  NEGATIVE    MAU Course   Procedures None  MDM Patient is unsure about taking medications during pregnancy. Advised that Flexeril, Tylenol and Pepcid are safe in pregnancy. Patient would prefer to just try TUMS first.  Discussed RICE for muscle strain and need for moderation of activity until pain improves +FHTs Assessment and Plan  A: Muscle strain Acid reflux  P: Discharge home Rx for Pepcid and Flexeril sent to patient's pharmacy if she desires to try PRN Patient advised to moderate activity and use ice on the area to aid with inflammation Patient advised of second trimester warning signs Patient encouraged to keep scheduled follow-up with [facility] or call for earlier appointment if symptoms persist or worsen Patient may return to MAU as needed or if her condition were to change or worsen  Freddi Starr, PA-C  02/12/2013, 7:58 PM

## 2013-02-13 NOTE — MAU Provider Note (Signed)
Attestation of Attending Supervision of Advanced Practitioner (PA/CNM/NP): Evaluation and management procedures were performed by the Advanced Practitioner under my supervision and collaboration.  I have reviewed the Advanced Practitioner's note and chart, and I agree with the management and plan.  Pasqual Farias, MD, FACOG Attending Obstetrician & Gynecologist Faculty Practice, [facility]  

## 2013-02-14 ENCOUNTER — Other Ambulatory Visit: Payer: Self-pay | Admitting: Women's Health

## 2013-02-14 ENCOUNTER — Ambulatory Visit (INDEPENDENT_AMBULATORY_CARE_PROVIDER_SITE_OTHER): Payer: Medicaid Other | Admitting: Women's Health

## 2013-02-14 ENCOUNTER — Ambulatory Visit (INDEPENDENT_AMBULATORY_CARE_PROVIDER_SITE_OTHER): Payer: Medicaid Other

## 2013-02-14 VITALS — BP 114/68 | Wt 182.6 lb

## 2013-02-14 DIAGNOSIS — Z3482 Encounter for supervision of other normal pregnancy, second trimester: Secondary | ICD-10-CM

## 2013-02-14 DIAGNOSIS — O09299 Supervision of pregnancy with other poor reproductive or obstetric history, unspecified trimester: Secondary | ICD-10-CM

## 2013-02-14 DIAGNOSIS — O99019 Anemia complicating pregnancy, unspecified trimester: Secondary | ICD-10-CM

## 2013-02-14 DIAGNOSIS — Z331 Pregnant state, incidental: Secondary | ICD-10-CM

## 2013-02-14 DIAGNOSIS — Z1389 Encounter for screening for other disorder: Secondary | ICD-10-CM

## 2013-02-14 LAB — POCT URINALYSIS DIPSTICK
Blood, UA: NEGATIVE
Glucose, UA: NEGATIVE
Ketones, UA: NEGATIVE
Leukocytes, UA: NEGATIVE
Nitrite, UA: NEGATIVE
Protein, UA: NEGATIVE

## 2013-02-14 NOTE — Patient Instructions (Signed)
Pregnancy - Second Trimester The second trimester of pregnancy (3 to 6 months) is a period of rapid growth for you and your baby. At the end of the sixth month, your baby is about 9 inches long and weighs 1 1/2 pounds. You will begin to feel the baby move between 18 and 20 weeks of the pregnancy. This is called quickening. Weight gain is faster. A clear fluid (colostrum) may leak out of your breasts. You may feel small contractions of the womb (uterus). This is known as false labor or Braxton-Hicks contractions. This is like a practice for labor when the baby is ready to be born. Usually, the problems with morning sickness have usually passed by the end of your first trimester. Some women develop small dark blotches (called cholasma, mask of pregnancy) on their face that usually goes away after the baby is born. Exposure to the sun makes the blotches worse. Acne may also develop in some pregnant women and pregnant women who have acne, may find that it goes away. PRENATAL EXAMS  Blood work may continue to be done during prenatal exams. These tests are done to check on your health and the probable health of your baby. Blood work is used to follow your blood levels (hemoglobin). Anemia (low hemoglobin) is common during pregnancy. Iron and vitamins are given to help prevent this. You will also be checked for diabetes between 24 and 28 weeks of the pregnancy. Some of the previous blood tests may be repeated.  The size of the uterus is measured during each visit. This is to make sure that the baby is continuing to grow properly according to the dates of the pregnancy.  Your blood pressure is checked every prenatal visit. This is to make sure you are not getting toxemia.  Your urine is checked to make sure you do not have an infection, diabetes or protein in the urine.  Your weight is checked often to make sure gains are happening at the suggested rate. This is to ensure that both you and your baby are  growing normally.  Sometimes, an ultrasound is performed to confirm the proper growth and development of the baby. This is a test which bounces harmless sound waves off the baby so your caregiver can more accurately determine due dates. Sometimes, a test is done on the amniotic fluid surrounding the baby. This test is called an amniocentesis. The amniotic fluid is obtained by sticking a needle into the belly (abdomen). This is done to check the chromosomes in instances where there is a concern about possible genetic problems with the baby. It is also sometimes done near the end of pregnancy if an early delivery is required. In this case, it is done to help make sure the baby's lungs are mature enough for the baby to live outside of the womb. CHANGES OCCURING IN THE SECOND TRIMESTER OF PREGNANCY Your body goes through many changes during pregnancy. They vary from person to person. Talk to your caregiver about changes you notice that you are concerned about.  During the second trimester, you will likely have an increase in your appetite. It is normal to have cravings for certain foods. This varies from person to person and pregnancy to pregnancy.  Your lower abdomen will begin to bulge.  You may have to urinate more often because the uterus and baby are pressing on your bladder. It is also common to get more bladder infections during pregnancy. You can help this by drinking lots of fluids   and emptying your bladder before and after intercourse.  You may begin to get stretch marks on your hips, abdomen, and breasts. These are normal changes in the body during pregnancy. There are no exercises or medicines to take that prevent this change.  You may begin to develop swollen and bulging veins (varicose veins) in your legs. Wearing support hose, elevating your feet for 15 minutes, 3 to 4 times a day and limiting salt in your diet helps lessen the problem.  Heartburn may develop as the uterus grows and  pushes up against the stomach. Antacids recommended by your caregiver helps with this problem. Also, eating smaller meals 4 to 5 times a day helps.  Constipation can be treated with a stool softener or adding bulk to your diet. Drinking lots of fluids, and eating vegetables, fruits, and whole grains are helpful.  Exercising is also helpful. If you have been very active up until your pregnancy, most of these activities can be continued during your pregnancy. If you have been less active, it is helpful to start an exercise program such as walking.  Hemorrhoids may develop at the end of the second trimester. Warm sitz baths and hemorrhoid cream recommended by your caregiver helps hemorrhoid problems.  Backaches may develop during this time of your pregnancy. Avoid heavy lifting, wear low heal shoes, and practice good posture to help with backache problems.  Some pregnant women develop tingling and numbness of their hand and fingers because of swelling and tightening of ligaments in the wrist (carpel tunnel syndrome). This goes away after the baby is born.  As your breasts enlarge, you may have to get a bigger bra. Get a comfortable, cotton, support bra. Do not get a nursing bra until the last month of the pregnancy if you will be nursing the baby.  You may get a dark line from your belly button to the pubic area called the linea nigra.  You may develop rosy cheeks because of increase blood flow to the face.  You may develop spider looking lines of the face, neck, arms, and chest. These go away after the baby is born. HOME CARE INSTRUCTIONS   It is extremely important to avoid all smoking, herbs, alcohol, and unprescribed drugs during your pregnancy. These chemicals affect the formation and growth of the baby. Avoid these chemicals throughout the pregnancy to ensure the delivery of a healthy infant.  Most of your home care instructions are the same as suggested for the first trimester of your  pregnancy. Keep your caregiver's appointments. Follow your caregiver's instructions regarding medicine use, exercise, and diet.  During pregnancy, you are providing food for you and your baby. Continue to eat regular, well-balanced meals. Choose foods such as meat, fish, milk and other low fat dairy products, vegetables, fruits, and whole-grain breads and cereals. Your caregiver will tell you of the ideal weight gain.  A physical sexual relationship may be continued up until near the end of pregnancy if there are no other problems. Problems could include early (premature) leaking of amniotic fluid from the membranes, vaginal bleeding, abdominal pain, or other medical or pregnancy problems.  Exercise regularly if there are no restrictions. Check with your caregiver if you are unsure of the safety of some of your exercises. The greatest weight gain will occur in the last 2 trimesters of pregnancy. Exercise will help you:  Control your weight.  Get you in shape for labor and delivery.  Lose weight after you have the baby.  Wear   a good support or jogging bra for breast tenderness during pregnancy. This may help if worn during sleep. Pads or tissues may be used in the bra if you are leaking colostrum.  Do not use hot tubs, steam rooms or saunas throughout the pregnancy.  Wear your seat belt at all times when driving. This protects you and your baby if you are in an accident.  Avoid raw meat, uncooked cheese, cat litter boxes, and soil used by cats. These carry germs that can cause birth defects in the baby.  The second trimester is also a good time to visit your dentist for your dental health if this has not been done yet. Getting your teeth cleaned is okay. Use a soft toothbrush. Brush gently during pregnancy.  It is easier to leak urine during pregnancy. Tightening up and strengthening the pelvic muscles will help with this problem. Practice stopping your urination while you are going to the  bathroom. These are the same muscles you need to strengthen. It is also the muscles you would use as if you were trying to stop from passing gas. You can practice tightening these muscles up 10 times a set and repeating this about 3 times per day. Once you know what muscles to tighten up, do not perform these exercises during urination. It is more likely to contribute to an infection by backing up the urine.  Ask for help if you have financial, counseling, or nutritional needs during pregnancy. Your caregiver will be able to offer counseling for these needs as well as refer you for other special needs.  Your skin may become oily. If so, wash your face with mild soap, use non-greasy moisturizer and oil or cream based makeup. MEDICINES AND DRUG USE IN PREGNANCY  Take prenatal vitamins as directed. The vitamin should contain 1 milligram of folic acid. Keep all vitamins out of reach of children. Only a couple vitamins or tablets containing iron may be fatal to a baby or young child when ingested.  Avoid use of all medicines, including herbs, over-the-counter medicines, not prescribed or suggested by your caregiver. Only take over-the-counter or prescription medicines for pain, discomfort, or fever as directed by your caregiver. Do not use aspirin.  Let your caregiver also know about herbs you may be using.  Alcohol is related to a number of birth defects. This includes fetal alcohol syndrome. All alcohol, in any form, should be avoided completely. Smoking will cause low birth rate and premature babies.  Street or illegal drugs are very harmful to the baby. They are absolutely forbidden. A baby born to an addicted mother will be addicted at birth. The baby will go through the same withdrawal an adult does. SEEK MEDICAL CARE IF:  You have any concerns or worries during your pregnancy. It is better to call with your questions if you feel they cannot wait, rather than worry about them. SEEK IMMEDIATE  MEDICAL CARE IF:   An unexplained oral temperature above 102 F (38.9 C) develops, or as your caregiver suggests.  You have leaking of fluid from the vagina (birth canal). If leaking membranes are suspected, take your temperature and tell your caregiver of this when you call.  There is vaginal spotting, bleeding, or passing clots. Tell your caregiver of the amount and how many pads are used. Light spotting in pregnancy is common, especially following intercourse.  You develop a bad smelling vaginal discharge with a change in the color from clear to white.  You continue to feel   sick to your stomach (nauseated) and have no relief from remedies suggested. You vomit blood or coffee ground-like materials.  You lose more than 2 pounds of weight or gain more than 2 pounds of weight over 1 week, or as suggested by your caregiver.  You notice swelling of your face, hands, feet, or legs.  You get exposed to German measles and have never had them.  You are exposed to fifth disease or chickenpox.  You develop belly (abdominal) pain. Round ligament discomfort is a common non-cancerous (benign) cause of abdominal pain in pregnancy. Your caregiver still must evaluate you.  You develop a bad headache that does not go away.  You develop fever, diarrhea, pain with urination, or shortness of breath.  You develop visual problems, blurry, or double vision.  You fall or are in a car accident or any kind of trauma.  There is mental or physical violence at home. Document Released: 07/01/2001 Document Revised: 03/31/2012 Document Reviewed: 01/03/2009 [employer] Patient Information 2014 [employer].  

## 2013-02-14 NOTE — Progress Notes (Signed)
Reports good fm. Denies uc's, lof, vb, urinary frequency, urgency, hesitancy, or dysuria.  No complaints.  Reviewed u/s results from today, warning s/s to report.  All questions answered. F/U in 4wks for visit.

## 2013-02-14 NOTE — Progress Notes (Signed)
No complaints at this time. Ultrasound today. 

## 2013-02-14 NOTE — Progress Notes (Signed)
Anatomy Screen complete, All anat appears nml. Meas. C/w dates.,cx closed = 4.1cm, fluid nml, anterior high plac., female fetus, active, FHT= 155 /bpm,bilat ovs appear nml

## 2013-03-14 ENCOUNTER — Ambulatory Visit (INDEPENDENT_AMBULATORY_CARE_PROVIDER_SITE_OTHER): Payer: Medicaid Other | Admitting: Women's Health

## 2013-03-14 ENCOUNTER — Encounter: Payer: Self-pay | Admitting: Women's Health

## 2013-03-14 VITALS — BP 110/80 | Wt 187.0 lb

## 2013-03-14 DIAGNOSIS — Z3482 Encounter for supervision of other normal pregnancy, second trimester: Secondary | ICD-10-CM

## 2013-03-14 DIAGNOSIS — O99019 Anemia complicating pregnancy, unspecified trimester: Secondary | ICD-10-CM

## 2013-03-14 DIAGNOSIS — Z1389 Encounter for screening for other disorder: Secondary | ICD-10-CM

## 2013-03-14 DIAGNOSIS — O09299 Supervision of pregnancy with other poor reproductive or obstetric history, unspecified trimester: Secondary | ICD-10-CM

## 2013-03-14 DIAGNOSIS — Z331 Pregnant state, incidental: Secondary | ICD-10-CM

## 2013-03-14 LAB — POCT URINALYSIS DIPSTICK
Blood, UA: NEGATIVE
Glucose, UA: NEGATIVE
Ketones, UA: NEGATIVE
Leukocytes, UA: NEGATIVE
Nitrite, UA: NEGATIVE
Protein, UA: NEGATIVE

## 2013-03-14 NOTE — Progress Notes (Signed)
Reports good fm. Denies uc's, lof, vb, urinary frequency, urgency, hesitancy, or dysuria.  No complaints. Reviewed ptl s/s.  All questions answered. F/U in 4wks for PN2 and visit.

## 2013-03-14 NOTE — Patient Instructions (Addendum)
You will have your sugar test next visit.  Please do not eat or drink anything after midnight the night before you come, not even water.  You will be here for at least two hours.    

## 2013-04-11 ENCOUNTER — Other Ambulatory Visit: Payer: Medicaid Other | Admitting: Obstetrics & Gynecology

## 2013-04-11 ENCOUNTER — Ambulatory Visit: Payer: Medicaid Other | Admitting: Obstetrics & Gynecology

## 2013-04-11 ENCOUNTER — Ambulatory Visit (INDEPENDENT_AMBULATORY_CARE_PROVIDER_SITE_OTHER): Payer: Medicaid Other | Admitting: Obstetrics & Gynecology

## 2013-04-11 ENCOUNTER — Other Ambulatory Visit: Payer: Medicaid Other

## 2013-04-11 ENCOUNTER — Encounter: Payer: Self-pay | Admitting: Obstetrics & Gynecology

## 2013-04-11 VITALS — BP 100/82 | Wt 188.5 lb

## 2013-04-11 DIAGNOSIS — Z3482 Encounter for supervision of other normal pregnancy, second trimester: Secondary | ICD-10-CM

## 2013-04-11 DIAGNOSIS — Z23 Encounter for immunization: Secondary | ICD-10-CM

## 2013-04-11 DIAGNOSIS — O09299 Supervision of pregnancy with other poor reproductive or obstetric history, unspecified trimester: Secondary | ICD-10-CM

## 2013-04-11 DIAGNOSIS — Z331 Pregnant state, incidental: Secondary | ICD-10-CM

## 2013-04-11 DIAGNOSIS — Z1389 Encounter for screening for other disorder: Secondary | ICD-10-CM

## 2013-04-11 DIAGNOSIS — O99019 Anemia complicating pregnancy, unspecified trimester: Secondary | ICD-10-CM

## 2013-04-11 LAB — POCT URINALYSIS DIPSTICK
Blood, UA: NEGATIVE
Glucose, UA: NEGATIVE
Ketones, UA: NEGATIVE
Nitrite, UA: NEGATIVE
Protein, UA: NEGATIVE

## 2013-04-11 LAB — CBC
HCT: 33.9 % — ABNORMAL LOW (ref 36.0–46.0)
Hemoglobin: 11.6 g/dL — ABNORMAL LOW (ref 12.0–15.0)
MCH: 28.2 pg (ref 26.0–34.0)
MCHC: 34.2 g/dL (ref 30.0–36.0)
MCV: 82.5 fL (ref 78.0–100.0)
Platelets: 305 10*3/uL (ref 150–400)
RBC: 4.11 MIL/uL (ref 3.87–5.11)
RDW: 14 % (ref 11.5–15.5)
WBC: 8 10*3/uL (ref 4.0–10.5)

## 2013-04-11 LAB — RPR

## 2013-04-11 LAB — HIV ANTIBODY (ROUTINE TESTING W REFLEX): HIV: NONREACTIVE

## 2013-04-11 MED ORDER — INFLUENZA VAC SPLIT QUAD 0.5 ML IM SUSP
0.5000 mL | Freq: Once | INTRAMUSCULAR | Status: AC
  Administered 2013-04-11: 0.5 mL via INTRAMUSCULAR

## 2013-04-11 MED ORDER — INFLUENZA VAC SPLIT QUAD 0.5 ML IM SUSP: 0.5000 mL | INTRAMUSCULAR | Status: DC

## 2013-04-11 NOTE — Progress Notes (Signed)
BP weight and urine results all reviewed and noted. Patient reports good fetal movement, denies any bleeding and no rupture of membranes symptoms or regular contractions. Patient is without complaints. All questions were answered.  

## 2013-04-11 NOTE — Addendum Note (Signed)
Addended by: Colen Darling on: 04/11/2013 12:13 PM   Modules accepted: Orders

## 2013-04-11 NOTE — Addendum Note (Signed)
Addended by: Colen Darling on: 04/11/2013 11:45 AM   Modules accepted: Orders

## 2013-04-12 LAB — ANTIBODY SCREEN: Antibody Screen: NEGATIVE

## 2013-04-12 LAB — HSV 2 ANTIBODY, IGG: HSV 2 Glycoprotein G Ab, IgG: 0.19 IV

## 2013-04-12 LAB — GLUCOSE TOLERANCE, 2 HOURS W/ 1HR
Glucose, 1 hour: 109 mg/dL (ref 70–170)
Glucose, 2 hour: 103 mg/dL (ref 70–139)
Glucose, Fasting: 77 mg/dL (ref 70–99)

## 2013-04-13 ENCOUNTER — Encounter: Payer: Self-pay | Admitting: Obstetrics & Gynecology

## 2013-04-13 NOTE — Progress Notes (Signed)
Patient ID: Elizabeth Horton, female   DOB: 03/08/1986, 27 y.o.   MRN: [id number] BP weight and urine results all reviewed and noted.  Patient reports good fetal movement, denies any bleeding and no rupture of membranes symptoms or regular contractions. Patient is without complaints.  All questions were answered

## 2013-04-20 ENCOUNTER — Telehealth: Payer: Self-pay | Admitting: Obstetrics and Gynecology

## 2013-04-20 NOTE — Telephone Encounter (Signed)
Left message x 1. JSY 

## 2013-04-25 NOTE — Telephone Encounter (Signed)
Left message x 2. JSY 

## 2013-04-28 NOTE — Telephone Encounter (Signed)
Left message x 3. JSY 

## 2013-04-29 NOTE — Telephone Encounter (Signed)
Tried to reach pt 3 different times. Never talked to pt. Encounter closed. JSY

## 2013-05-02 ENCOUNTER — Ambulatory Visit (INDEPENDENT_AMBULATORY_CARE_PROVIDER_SITE_OTHER): Payer: Medicaid Other | Admitting: Women's Health

## 2013-05-02 ENCOUNTER — Encounter: Payer: Self-pay | Admitting: Women's Health

## 2013-05-02 VITALS — BP 116/76 | Wt 193.0 lb

## 2013-05-02 DIAGNOSIS — Z331 Pregnant state, incidental: Secondary | ICD-10-CM

## 2013-05-02 DIAGNOSIS — O99019 Anemia complicating pregnancy, unspecified trimester: Secondary | ICD-10-CM

## 2013-05-02 DIAGNOSIS — O09299 Supervision of pregnancy with other poor reproductive or obstetric history, unspecified trimester: Secondary | ICD-10-CM

## 2013-05-02 DIAGNOSIS — Z3483 Encounter for supervision of other normal pregnancy, third trimester: Secondary | ICD-10-CM

## 2013-05-02 DIAGNOSIS — Z1389 Encounter for screening for other disorder: Secondary | ICD-10-CM

## 2013-05-02 LAB — POCT URINALYSIS DIPSTICK
Blood, UA: NEGATIVE
Glucose, UA: NEGATIVE
Ketones, UA: NEGATIVE
Leukocytes, UA: NEGATIVE
Nitrite, UA: NEGATIVE
Protein, UA: NEGATIVE

## 2013-05-02 NOTE — Progress Notes (Signed)
Reports good fm. Denies uc's, lof, vb, urinary frequency, urgency, hesitancy, or dysuria.  Achy pain in Rt upper posterior thigh around into groin, 'feels like a pulled muscle', hurts worse w/ movement, feels better w/ massage.  No erythema, edema, streaks, cords. Still thinking about waterbirth, may just want to labor in tub. Hasn't gone to class yet, recommended going to class soon. Reviewed ptl s/s, fkc, pn2 results.  All questions answered. F/U in 2wks for visit.

## 2013-05-02 NOTE — Patient Instructions (Signed)

## 2013-05-17 ENCOUNTER — Ambulatory Visit (INDEPENDENT_AMBULATORY_CARE_PROVIDER_SITE_OTHER): Payer: Medicaid Other | Admitting: Advanced Practice Midwife

## 2013-05-17 VITALS — BP 110/70 | Wt 196.0 lb

## 2013-05-17 DIAGNOSIS — Z1389 Encounter for screening for other disorder: Secondary | ICD-10-CM

## 2013-05-17 DIAGNOSIS — O09299 Supervision of pregnancy with other poor reproductive or obstetric history, unspecified trimester: Secondary | ICD-10-CM

## 2013-05-17 DIAGNOSIS — O99019 Anemia complicating pregnancy, unspecified trimester: Secondary | ICD-10-CM

## 2013-05-17 DIAGNOSIS — Z331 Pregnant state, incidental: Secondary | ICD-10-CM

## 2013-05-17 LAB — POCT URINALYSIS DIPSTICK
Blood, UA: NEGATIVE
Glucose, UA: NEGATIVE
Ketones, UA: NEGATIVE
Leukocytes, UA: NEGATIVE
Nitrite, UA: NEGATIVE
Protein, UA: NEGATIVE

## 2013-05-17 NOTE — Progress Notes (Signed)
Patient still think about laboring in the water. Plans to attend the waterbirth class next month. Discussed an ultrasound for estimated fetal weigh around 37-38 weeks due to history of shoulder dystocia. No complaints or concerns at this time. FU in 2 weeks

## 2013-06-02 ENCOUNTER — Encounter: Payer: Self-pay | Admitting: Advanced Practice Midwife

## 2013-06-02 ENCOUNTER — Ambulatory Visit (INDEPENDENT_AMBULATORY_CARE_PROVIDER_SITE_OTHER): Payer: Medicaid Other | Admitting: Advanced Practice Midwife

## 2013-06-02 VITALS — BP 108/82 | Wt 195.5 lb

## 2013-06-02 DIAGNOSIS — Z331 Pregnant state, incidental: Secondary | ICD-10-CM

## 2013-06-02 DIAGNOSIS — Z1389 Encounter for screening for other disorder: Secondary | ICD-10-CM

## 2013-06-02 DIAGNOSIS — O09299 Supervision of pregnancy with other poor reproductive or obstetric history, unspecified trimester: Secondary | ICD-10-CM

## 2013-06-02 DIAGNOSIS — O99019 Anemia complicating pregnancy, unspecified trimester: Secondary | ICD-10-CM

## 2013-06-02 DIAGNOSIS — Z3483 Encounter for supervision of other normal pregnancy, third trimester: Secondary | ICD-10-CM

## 2013-06-02 LAB — POCT URINALYSIS DIPSTICK
Blood, UA: NEGATIVE
Glucose, UA: NEGATIVE
Ketones, UA: NEGATIVE
Leukocytes, UA: NEGATIVE
Nitrite, UA: NEGATIVE

## 2013-06-02 NOTE — Progress Notes (Signed)
Having some irregular contractions. Denies LOF and VB. Good FM. Still thinking about a waterbirth, plans to attend Nov. 19 class. Signs of labor reviewed. U/s for estimated fetal weight in 4 weeks due to history of shoulder dystocia. F/u in 2 weeks for visit.

## 2013-06-02 NOTE — Patient Instructions (Signed)
For waterbirth you will need a liner (Birth Pool in a Box, regular size), pump to remove water, one garden water hose, one drinking water hose, and fish net. Typically cheaper buying separate parts than buying the kit.

## 2013-06-14 ENCOUNTER — Ambulatory Visit (INDEPENDENT_AMBULATORY_CARE_PROVIDER_SITE_OTHER): Payer: Medicaid Other | Admitting: Women's Health

## 2013-06-14 ENCOUNTER — Encounter: Payer: Self-pay | Admitting: Women's Health

## 2013-06-14 VITALS — BP 122/80 | Wt 196.0 lb

## 2013-06-14 DIAGNOSIS — O09299 Supervision of pregnancy with other poor reproductive or obstetric history, unspecified trimester: Secondary | ICD-10-CM

## 2013-06-14 DIAGNOSIS — Z3483 Encounter for supervision of other normal pregnancy, third trimester: Secondary | ICD-10-CM

## 2013-06-14 DIAGNOSIS — Z331 Pregnant state, incidental: Secondary | ICD-10-CM

## 2013-06-14 DIAGNOSIS — Z1389 Encounter for screening for other disorder: Secondary | ICD-10-CM

## 2013-06-14 DIAGNOSIS — O99019 Anemia complicating pregnancy, unspecified trimester: Secondary | ICD-10-CM

## 2013-06-14 LAB — POCT URINALYSIS DIPSTICK
Blood, UA: NEGATIVE
Glucose, UA: NEGATIVE
Ketones, UA: NEGATIVE
Leukocytes, UA: NEGATIVE
Nitrite, UA: NEGATIVE
Protein, UA: NEGATIVE

## 2013-06-14 NOTE — Patient Instructions (Signed)
Considering Waterbirth? Guide for patients at [facility]  Why consider waterbirth?    Gentle birth for babies   Less pain medicine used in labor   May allow for passive descent/less pushing   May reduce perineal tears    More mobility and instinctive maternal position changes   Increased maternal relaxation   Reduced blood pressure in labor  Is waterbirth safe? What are the risks of infection, drowning or other complications?    Infection: o Very low risk (3.7 % for tub vs 4.8% for bed) o 7 in 8000 waterbirths with documented infection o Poorly cleaned equipment most common cause o Slightly lower group B strep transmission rate    Drowning o Maternal:    Very low risk     Related to seizures or fainting o Newborn:    Very low risk. No evidence of increased risk of respiratory problems in multiple large studies   Physiological protection from breathing under water   Avoid underwater birth if there are any fetal complications   Once baby's head is out of the water, keep it out.    Birth complication o Some reports of cord trauma, but risk decreased by bringing baby to surface gradually o No evidence of increased risk of shoulder dystocia. Mothers can usually change positions faster in water than in a bed, possibly aiding the maneuvers to free the shoulder.   Am I a candidate for waterbirth?  Yes, if you are:   Full-term (37 weeks or greater)    Have had an uncomplicated pregnancy and labor  No, if you have:   Preterm birth less than 37 weeks   Thick, particulate meconium stained fluid   Maternal fever over 101   Heavy bleeding or signs of placental abruption   Pre-eclampsia    Any abnormal fetal heart rate pattern   Breech presentation   Twins    Very large baby   Active communicable infection (this does NOT include group B strep)   Significant limitation to mobility  Please remember that birth is unpredictable. Under certain unforeseeable  circumstances your provider may advise against giving birth in the tub. These decisions will be made on a case-by-case basis and with the safety of you and your baby as our highest priority.  Requirements for patients planning waterbirth    Ask your midwife if you will be a candidate for waterbirth.   Attend the [facility] at [facility]. Contact Childbirth Education at [phone] or [phone] for dates and times. The class is free and we strongly encourage you to bring your support person. You will receive a certificate of participation to show to your midwife or doctor.   Supplies o [employer] (NOT kiddie pool) o Equities trader pump to inflate tub (manual-foot pump or electric) o New garden hose labeled "lead-free", "suitable for drinking water", "non-toxic" OR "water potable" o Faucet adaptor to attach hose to faucet         o Electric drain pump to remove water (We recommend 792 gallon per hour or greater pump.)  o Water thermometer (baby store / pool supplies) o Designer, jewellery top (optional) o Long-handled mirror (optional)   The above information was reviewed with the patient and she verbally consented and acknowledged the eligibility criteria as well as contraindications and procedures for both labor and birth. The patient also acknowledged that she has been informed that during the course of labor and birth unforeseen  conditions may occur which may require her to leave the water. The patients agrees to follow the instructions from the nurse, nurse midwife and/or physician including getting out of the tub if deemed medically necessary.   Preterm Labor Information Preterm labor is when labor starts at less than 37 weeks of pregnancy. The normal length of a pregnancy is 39 to 41 weeks. CAUSES Often, there is no identifiable underlying cause as to why a woman goes into preterm labor. One of the most common known causes of preterm labor  is infection. Infections of the uterus, cervix, vagina, amniotic sac, bladder, kidney, or even the lungs (pneumonia) can cause labor to start. Other suspected causes of preterm labor include:   Urogenital infections, such as yeast infections and bacterial vaginosis.   Uterine abnormalities (uterine shape, uterine septum, fibroids, or bleeding from the placenta).   A cervix that has been operated on (it may fail to stay closed).   Malformations in the fetus.   Multiple gestations (twins, triplets, and so on).   Breakage of the amniotic sac.  RISK FACTORS  Having a previous history of preterm labor.   Having premature rupture of membranes (PROM).   Having a placenta that covers the opening of the cervix (placenta previa).   Having a placenta that separates from the uterus (placental abruption).   Having a cervix that is too weak to hold the fetus in the uterus (incompetent cervix).   Having too much fluid in the amniotic sac (polyhydramnios).   Taking illegal drugs or smoking while pregnant.   Not gaining enough weight while pregnant.   Being younger than 25 and older than 27 years old.   Having a low socioeconomic status.   Being African American. SYMPTOMS Signs and symptoms of preterm labor include:   Menstrual-like cramps, abdominal pain, or back pain.  Uterine contractions that are regular, as frequent as six in an hour, regardless of their intensity (may be mild or painful).  Contractions that start on the top of the uterus and spread down to the lower abdomen and back.   A sense of increased pelvic pressure.   A watery or bloody mucus discharge that comes from the vagina.  TREATMENT Depending on the length of the pregnancy and other circumstances, your health care provider may suggest bed rest. If necessary, there are medicines that can be given to stop contractions and to mature the fetal lungs. If labor happens before 34 weeks of pregnancy, a  prolonged hospital stay may be recommended. Treatment depends on the condition of both you and the fetus.  WHAT SHOULD YOU DO IF YOU THINK YOU ARE IN PRETERM LABOR? Call your health care provider right away. You will need to go to the hospital to get checked immediately. HOW CAN YOU PREVENT PRETERM LABOR IN FUTURE PREGNANCIES? You should:   Stop smoking if you smoke.  Maintain healthy weight gain and avoid chemicals and drugs that are not necessary.  Be watchful for any type of infection.  Inform your health care provider if you have a known history of preterm labor. Document Released: 09/27/2003 Document Revised: 03/09/2013 Document Reviewed: 08/09/2012 [facility] Patient Information 2014 [place], [place].

## 2013-06-14 NOTE — Progress Notes (Signed)
Reports good fm. Denies uc's, lof, vb, urinary frequency, urgency, hesitancy, or dysuria.  No complaints.  Missed 11/19 WB class, has registered for 12/10 class. Growth u/s already scheduled for 38wks d/t h/o SD w/ 7lb 10.8oz baby. Will discuss WB further after u/s. Reviewed ptl s/s, fkc.  All questions answered. F/U in 1wk for visit and gbs.

## 2013-06-21 ENCOUNTER — Encounter: Payer: Self-pay | Admitting: Women's Health

## 2013-06-21 ENCOUNTER — Ambulatory Visit (INDEPENDENT_AMBULATORY_CARE_PROVIDER_SITE_OTHER): Payer: Medicaid Other | Admitting: Women's Health

## 2013-06-21 VITALS — BP 122/70 | Wt 200.2 lb

## 2013-06-21 DIAGNOSIS — Z3483 Encounter for supervision of other normal pregnancy, third trimester: Secondary | ICD-10-CM

## 2013-06-21 DIAGNOSIS — Z1389 Encounter for screening for other disorder: Secondary | ICD-10-CM

## 2013-06-21 DIAGNOSIS — Z331 Pregnant state, incidental: Secondary | ICD-10-CM

## 2013-06-21 DIAGNOSIS — O09299 Supervision of pregnancy with other poor reproductive or obstetric history, unspecified trimester: Secondary | ICD-10-CM

## 2013-06-21 DIAGNOSIS — O99019 Anemia complicating pregnancy, unspecified trimester: Secondary | ICD-10-CM

## 2013-06-21 DIAGNOSIS — O09293 Supervision of pregnancy with other poor reproductive or obstetric history, third trimester: Secondary | ICD-10-CM

## 2013-06-21 LAB — POCT URINALYSIS DIPSTICK
Blood, UA: NEGATIVE
Glucose, UA: NEGATIVE
Ketones, UA: NEGATIVE
Leukocytes, UA: NEGATIVE
Nitrite, UA: NEGATIVE
Protein, UA: NEGATIVE

## 2013-06-21 NOTE — Patient Instructions (Signed)
Call the office or go to [facility] if:  You begin to have strong, frequent contractions  Your water breaks.  Sometimes it is a big gush of fluid, sometimes it is just a trickle that keeps getting your panties wet or running down your legs  You have vaginal bleeding.  It is normal to have a small amount of spotting if your cervix was checked.   You don't feel your baby moving like normal.  If you don't, get you something to eat and drink and lay down and focus on feeling your baby move.  You should feel at least 10 movements in 2 hours.  If you don't, you should call the office or go to [facility].   Braxton Hicks Contractions Pregnancy is commonly associated with contractions of the uterus throughout the pregnancy. Towards the end of pregnancy (32 to 34 weeks), these contractions (Braxton Hicks) can develop more often and may become more forceful. This is not true labor because these contractions do not result in opening (dilatation) and thinning of the cervix. They are sometimes difficult to tell apart from true labor because these contractions can be forceful and people have different pain tolerances. You should not feel embarrassed if you go to the hospital with false labor. Sometimes, the only way to tell if you are in true labor is for your caregiver to follow the changes in the cervix. How to tell the difference between true and false labor:  False labor.  The contractions of false labor are usually shorter, irregular and not as hard as those of true labor.  They are often felt in the front of the lower abdomen and in the groin.  They may leave with walking around or changing positions while lying down.  They get weaker and are shorter lasting as time goes on.  These contractions are usually irregular.  They do not usually become progressively stronger, regular and closer together as with true labor.  True labor.  Contractions in true labor last 30 to 70 seconds,  become very regular, usually become more intense, and increase in frequency.  They do not go away with walking.  The discomfort is usually felt in the top of the uterus and spreads to the lower abdomen and low back.  True labor can be determined by your caregiver with an exam. This will show that the cervix is dilating and getting thinner. If there are no prenatal problems or other health problems associated with the pregnancy, it is completely safe to be sent home with false labor and await the onset of true labor. HOME CARE INSTRUCTIONS   Keep up with your usual exercises and instructions.  Take medications as directed.  Keep your regular prenatal appointment.  Eat and drink lightly if you think you are going into labor.  If BH contractions are making you uncomfortable:  Change your activity position from lying down or resting to walking/walking to resting.  Sit and rest in a tub of warm water.  Drink 2 to 3 glasses of water. Dehydration may cause B-H contractions.  Do slow and deep breathing several times an hour. SEEK IMMEDIATE MEDICAL CARE IF:   Your contractions continue to become stronger, more regular, and closer together.  You have a gushing, burst or leaking of fluid from the vagina.  An oral temperature above 102 F (38.9 C) develops.  You have passage of blood-tinged mucus.  You develop vaginal bleeding.  You develop continuous belly (abdominal) pain.  You have   low back pain that you never had before.  You feel the baby's head pushing down causing pelvic pressure.  The baby is not moving as much as it used to. Document Released: 07/07/2005 Document Revised: 09/29/2011 Document Reviewed: 12/29/2008 Exit[employer] Patient Information 2014 [employer].  

## 2013-06-21 NOTE — Progress Notes (Signed)
Reports good fm. Denies regular uc's, lof, vb, urinary frequency, urgency, hesitancy, or dysuria.  No complaints.  GBS today. Requests SVE. Vtx by leopold's. Fingers palpated on SVE. Reviewed labor s/s, fkc.  All questions answered. F/U in 1wk for visit and growth u/s d/t h/o SD, contemplating waterbirth.

## 2013-06-22 LAB — GC/CHLAMYDIA PROBE AMP
CT Probe RNA: NEGATIVE
GC Probe RNA: NEGATIVE

## 2013-06-23 LAB — STREP B DNA PROBE: GBSP: NEGATIVE

## 2013-06-24 ENCOUNTER — Encounter: Payer: Self-pay | Admitting: Women's Health

## 2013-06-27 ENCOUNTER — Inpatient Hospital Stay (HOSPITAL_COMMUNITY)
Admission: AD | Admit: 2013-06-27 | Discharge: 2013-06-27 | Disposition: A | Discharge status: Home / Self Care | Payer: Medicaid Other | Source: Ambulatory Visit | Attending: Obstetrics & Gynecology | Admitting: Obstetrics & Gynecology

## 2013-06-27 ENCOUNTER — Encounter (HOSPITAL_COMMUNITY): Payer: Self-pay | Admitting: *Deleted

## 2013-06-27 DIAGNOSIS — Z3483 Encounter for supervision of other normal pregnancy, third trimester: Secondary | ICD-10-CM

## 2013-06-27 DIAGNOSIS — O368131 Decreased fetal movements, third trimester, fetus 1: Secondary | ICD-10-CM

## 2013-06-27 DIAGNOSIS — O368190 Decreased fetal movements, unspecified trimester, not applicable or unspecified: Secondary | ICD-10-CM

## 2013-06-27 DIAGNOSIS — O09291 Supervision of pregnancy with other poor reproductive or obstetric history, first trimester: Secondary | ICD-10-CM

## 2013-06-27 HISTORY — DX: Reserved for concepts with insufficient information to code with codable children: IMO0002

## 2013-06-27 HISTORY — DX: Unspecified abnormal cytological findings in specimens from cervix uteri: R87.619

## 2013-06-27 NOTE — MAU Provider Note (Signed)
Chief Complaint:  Decreased Fetal Movement   Elizabeth Horton is a 27 y.o.  G2P1001 with IUP at [redacted]w[redacted]d presenting for Decreased Fetal Movement  Patient noticed that starting earlier today around 1 pm her baby wasn't moving as much as normally. Did kick counts and had less than 10 in 2 hours. Drank something sugary and still didn't improve.  So decided to come in. Since coming, baby is moving normally again.   No fevers, chills, headaches, ctx, vb, lof.    Menstrual History: OB History   Grav Para Term Preterm Abortions TAB SAB Ect Mult Living   2 1 1  0 0 0 0 0 0 1     Patient's last menstrual period was 09/23/2012.      Past Medical History  Diagnosis Date  . No pertinent past medical history   . Pneumonia   . Abnormal pap   . Abnormal Pap smear     Past Surgical History  Procedure Laterality Date  . Leep    . Mouth surgery  2014    wisdom teeth removed    Family History  Problem Relation Age of Onset  . Lung cancer Paternal Grandfather     94s  . Breast cancer Paternal Grandmother     21s  . Breast cancer Paternal Aunt   . Colon cancer Father     age 77 diagnosed, succumbed at age 110  . Hypertension Mother   . Thyroid disease Mother   . Colon cancer Sister     Three tumors found at time of colonoscopy. age 41.  . Breast cancer Other     aunt  . Diabetes Maternal Grandmother   . Diabetes Maternal Grandfather     History  Substance Use Topics  . Smoking status: Never Smoker   . Smokeless tobacco: Never Used  . Alcohol Use: No     No Known Allergies  Prescriptions prior to admission  Medication Sig Dispense Refill  . prenatal vitamin w/FE, FA (PRENATAL 1 + 1) 27-1 MG TABS Take 1 tablet by mouth daily.  30 each  5  . cyclobenzaprine (FLEXERIL) 10 MG tablet Take 1 tablet (10 mg total) by mouth 2 (two) times daily as needed for muscle spasms.  20 tablet  0  . famotidine (PEPCID) 20 MG tablet Take 1 tablet (20 mg total) by mouth 2 (two) times daily.  30  tablet  0    Review of Systems - Negative except for what is mentioned in HPI.  Physical Exam  Blood pressure 119/70, pulse 94, temperature 99 F (37.2 C), temperature source Oral, resp. rate 18, height 5\' 3"  (1.6 m), weight 92.08 kg (203 lb), last menstrual period 09/23/2012, SpO2 100.00%. GENERAL: Well-developed, well-nourished female in no acute distress.  LUNGS: normal effort  HEART: Regular rate and rhythm. ABDOMEN: Soft, nontender, nondistended, gravid.  EXTREMITIES: Nontender, no edema, 2+ distal pulses. FHT:  Baseline rate 130-140s bpm   Variability moderate  Accelerations present   Decelerations none Contractions: occcasional irritability.    Labs: No results found for this or any previous visit (from the past 24 hour(s)).  Imaging Studies:  No results found.  Assessment: Elizabeth Horton is  27 y.o. G2P1001 at [redacted]w[redacted]d presents with Decreased Fetal Movement .  Plan: - Now feeling normal movement again.  - NST- reactive and category I with beautiful accelerations - reassurance given  - kick counts reviewed - labor precautions discussed - [facility].   Elizabeth Horton  L 12/8/201411:29 PM

## 2013-06-27 NOTE — MAU Note (Signed)
Pt reports decreased fetal movement today.  

## 2013-06-30 ENCOUNTER — Ambulatory Visit (INDEPENDENT_AMBULATORY_CARE_PROVIDER_SITE_OTHER): Payer: Medicaid Other

## 2013-06-30 ENCOUNTER — Other Ambulatory Visit: Payer: Self-pay | Admitting: Advanced Practice Midwife

## 2013-06-30 ENCOUNTER — Ambulatory Visit (INDEPENDENT_AMBULATORY_CARE_PROVIDER_SITE_OTHER): Payer: Medicaid Other | Admitting: Advanced Practice Midwife

## 2013-06-30 VITALS — BP 110/80 | Wt 200.0 lb

## 2013-06-30 DIAGNOSIS — O09299 Supervision of pregnancy with other poor reproductive or obstetric history, unspecified trimester: Secondary | ICD-10-CM

## 2013-06-30 DIAGNOSIS — Z1389 Encounter for screening for other disorder: Secondary | ICD-10-CM

## 2013-06-30 DIAGNOSIS — Z3483 Encounter for supervision of other normal pregnancy, third trimester: Secondary | ICD-10-CM

## 2013-06-30 DIAGNOSIS — O99019 Anemia complicating pregnancy, unspecified trimester: Secondary | ICD-10-CM

## 2013-06-30 DIAGNOSIS — Z331 Pregnant state, incidental: Secondary | ICD-10-CM

## 2013-06-30 LAB — POCT URINALYSIS DIPSTICK
Blood, UA: NEGATIVE
Glucose, UA: NEGATIVE
Ketones, UA: NEGATIVE
Leukocytes, UA: NEGATIVE
Nitrite, UA: NEGATIVE
Protein, UA: NEGATIVE

## 2013-06-30 NOTE — Patient Instructions (Addendum)
Considering Waterbirth? Guide for patients at [facility]  Why consider waterbirth?    Gentle birth for babies   Less pain medicine used in labor   May allow for passive descent/less pushing   May reduce perineal tears    More mobility and instinctive maternal position changes   Increased maternal relaxation   Reduced blood pressure in labor  Is waterbirth safe? What are the risks of infection, drowning or other complications?    Infection: o Very low risk (3.7 % for tub vs 4.8% for bed) o 7 in 8000 waterbirths with documented infection o Poorly cleaned equipment most common cause o Slightly lower group B strep transmission rate    Drowning o Maternal:    Very low risk     Related to seizures or fainting o Newborn:    Very low risk. No evidence of increased risk of respiratory problems in multiple large studies   Physiological protection from breathing under water   Avoid underwater birth if there are any fetal complications   Once baby's head is out of the water, keep it out.    Birth complication o Some reports of cord trauma, but risk decreased by bringing baby to surface gradually o No evidence of increased risk of shoulder dystocia. Mothers can usually change positions faster in water than in a bed, possibly aiding the maneuvers to free the shoulder.   Am I a candidate for waterbirth?  Yes, if you are:   Full-term (37 weeks or greater)    Have had an uncomplicated pregnancy and labor  No, if you have:   Preterm birth less than 37 weeks   Thick, particulate meconium stained fluid   Maternal fever over 101   Heavy bleeding or signs of placental abruption   Pre-eclampsia    Any abnormal fetal heart rate pattern   Breech presentation   Twins    Very large baby   Active communicable infection (this does NOT include group B strep)   Significant limitation to mobility  Please remember that birth is unpredictable. Under certain unforeseeable  circumstances your provider may advise against giving birth in the tub. These decisions will be made on a case-by-case basis and with the safety of you and your baby as our highest priority.  Requirements for patients planning waterbirth    Ask your midwife if you will be a candidate for waterbirth.   Attend the Waterbirth Class at [facility]. Contact Childbirth Education at [phone] or [phone] for dates and times. The class is free and we strongly encourage you to bring your support person. You will receive a certificate of participation to show to your midwife or doctor.   Supplies o Waterbirth tub (NOT kiddie pool) o Single-use disposable tub liner  o Air pump to inflate tub (manual-foot pump or electric) o New garden hose labeled "lead-free", "suitable for drinking water", "non-toxic" OR "water potable" o Faucet adaptor to attach hose to faucet         o Electric drain pump to remove water (We recommend 792 gallon per hour or greater pump.)  o Water thermometer (baby store / pool supplies) o Fish net o Bathing suit top (optional) o Long-handled mirror (optional)   The above information was reviewed with the patient and she verbally consented and acknowledged the eligibility criteria as well as contraindications and procedures for both labor and birth. The patient also acknowledged that she has been informed that during the course of labor and birth unforeseen   conditions may occur which may require her to leave the water. The patients agrees to follow the instructions from the nurse, nurse midwife and/or physician including getting out of the tub if deemed medically necessary.  Waterbirth Supplies:  2 garden hoses (One must say "clean" or "drinking water" or "chemical free"); faucet adaptor; Birth Tub liner (birthpool in a box--smaller liner fits our tub); fishnet   

## 2013-06-30 NOTE — Progress Notes (Signed)
U/S(38+2wks)-vtx active fetus, EFW 6 lb 15 oz (39th%tile), fluid wnl AFI-9.2cm, anterior Gr 1 placenta, female fetus "Saliyah", ??view of cardiac atria (unable to clear good 4 chamber view and OFT's)

## 2013-06-30 NOTE — Progress Notes (Signed)
?   Calcifications on OFT of heart.  Mention on PITT and recommend neonatal echo.  Went to waterbirth class and consent signed.  No c/o.

## 2013-07-07 ENCOUNTER — Encounter: Payer: Self-pay | Admitting: Advanced Practice Midwife

## 2013-07-07 ENCOUNTER — Ambulatory Visit (INDEPENDENT_AMBULATORY_CARE_PROVIDER_SITE_OTHER): Payer: Medicaid Other | Admitting: Advanced Practice Midwife

## 2013-07-07 VITALS — BP 116/80 | Wt 204.0 lb

## 2013-07-07 DIAGNOSIS — Z1389 Encounter for screening for other disorder: Secondary | ICD-10-CM

## 2013-07-07 DIAGNOSIS — O09299 Supervision of pregnancy with other poor reproductive or obstetric history, unspecified trimester: Secondary | ICD-10-CM

## 2013-07-07 DIAGNOSIS — Z331 Pregnant state, incidental: Secondary | ICD-10-CM

## 2013-07-07 DIAGNOSIS — O99019 Anemia complicating pregnancy, unspecified trimester: Secondary | ICD-10-CM

## 2013-07-07 LAB — POCT URINALYSIS DIPSTICK
Blood, UA: NEGATIVE
Glucose, UA: NEGATIVE
Ketones, UA: NEGATIVE
Leukocytes, UA: NEGATIVE
Nitrite, UA: NEGATIVE
Protein, UA: NEGATIVE

## 2013-07-07 NOTE — Progress Notes (Signed)
No c/o at this time.  Routine questions about pregnancy answered.  F/U in 1 weeks for LROB.  

## 2013-07-11 ENCOUNTER — Encounter (HOSPITAL_COMMUNITY): Payer: Self-pay

## 2013-07-11 ENCOUNTER — Inpatient Hospital Stay (HOSPITAL_COMMUNITY)
Admission: AD | Admit: 2013-07-11 | Discharge: 2013-07-15 | DRG: 766 | Disposition: A | Discharge status: Home / Self Care | Payer: Medicaid Other | Source: Ambulatory Visit | Attending: Obstetrics & Gynecology | Admitting: Obstetrics & Gynecology

## 2013-07-11 DIAGNOSIS — O09291 Supervision of pregnancy with other poor reproductive or obstetric history, first trimester: Secondary | ICD-10-CM

## 2013-07-11 DIAGNOSIS — Z3483 Encounter for supervision of other normal pregnancy, third trimester: Secondary | ICD-10-CM

## 2013-07-11 DIAGNOSIS — O324XX0 Maternal care for high head at term, not applicable or unspecified: Secondary | ICD-10-CM

## 2013-07-11 DIAGNOSIS — Z98891 History of uterine scar from previous surgery: Secondary | ICD-10-CM

## 2013-07-11 DIAGNOSIS — O4290 Premature rupture of membranes, unspecified as to length of time between rupture and onset of labor, unspecified weeks of gestation: Secondary | ICD-10-CM | POA: Diagnosis present

## 2013-07-11 LAB — OB RESULTS CONSOLE GC/CHLAMYDIA
Chlamydia: NEGATIVE
Gonorrhea: NEGATIVE

## 2013-07-11 LAB — CBC
HCT: 34.7 % — ABNORMAL LOW (ref 36.0–46.0)
Hemoglobin: 11.6 g/dL — ABNORMAL LOW (ref 12.0–15.0)
MCH: 27.7 pg (ref 26.0–34.0)
MCHC: 33.4 g/dL (ref 30.0–36.0)
MCV: 82.8 fL (ref 78.0–100.0)
Platelets: 253 10*3/uL (ref 150–400)
RBC: 4.19 MIL/uL (ref 3.87–5.11)
RDW: 15.6 % — ABNORMAL HIGH (ref 11.5–15.5)
WBC: 9.1 10*3/uL (ref 4.0–10.5)

## 2013-07-11 LAB — RPR: RPR Ser Ql: NONREACTIVE

## 2013-07-11 LAB — TYPE AND SCREEN
ABO/RH(D): B POS
Antibody Screen: NEGATIVE

## 2013-07-11 LAB — AMNISURE RUPTURE OF MEMBRANE (ROM) NOT AT ARMC: Amnisure ROM: POSITIVE

## 2013-07-11 LAB — ABO/RH: ABO/RH(D): B POS

## 2013-07-11 MED ORDER — IBUPROFEN 600 MG PO TABS: 600.0000 mg | ORAL_TABLET | Freq: Four times a day (QID) | ORAL | Status: DC | PRN

## 2013-07-11 MED ORDER — OXYTOCIN 40 UNITS IN LACTATED RINGERS INFUSION - SIMPLE MED: 62.5000 mL/h | INTRAVENOUS | Status: DC

## 2013-07-11 MED ORDER — OXYTOCIN BOLUS FROM INFUSION: 500.0000 mL | INTRAVENOUS | Status: DC

## 2013-07-11 MED ORDER — CITRIC ACID-SODIUM CITRATE 334-500 MG/5ML PO SOLN
30.0000 mL | ORAL | Status: DC | PRN
  Administered 2013-07-12: 30 mL via ORAL
  Filled 2013-07-11: qty 15

## 2013-07-11 MED ORDER — ONDANSETRON HCL 4 MG/2ML IJ SOLN: 4.0000 mg | Freq: Four times a day (QID) | INTRAMUSCULAR | Status: DC | PRN

## 2013-07-11 MED ORDER — OXYCODONE-ACETAMINOPHEN 5-325 MG PO TABS: 1.0000 | ORAL_TABLET | ORAL | Status: DC | PRN

## 2013-07-11 MED ORDER — LACTATED RINGERS IV SOLN
500.0000 mL | INTRAVENOUS | Status: DC | PRN
  Administered 2013-07-12: 500 mL via INTRAVENOUS

## 2013-07-11 MED ORDER — ACETAMINOPHEN 325 MG PO TABS: 650.0000 mg | ORAL_TABLET | ORAL | Status: DC | PRN

## 2013-07-11 MED ORDER — LIDOCAINE HCL (PF) 1 % IJ SOLN
30.0000 mL | INTRAMUSCULAR | Status: DC | PRN
  Filled 2013-07-11: qty 30

## 2013-07-11 MED ORDER — LACTATED RINGERS IV SOLN
INTRAVENOUS | Status: DC
  Administered 2013-07-12: 13:00:00 via INTRAVENOUS

## 2013-07-11 NOTE — OB Triage Provider Note (Signed)
 History     CSN: [id number]  Arrival date and time: 07/11/13 0521   None     Chief Complaint  Patient presents with  . Rupture of Membranes   HPI  Pt is a 27 yo G2P1001 at [redacted]w[redacted]d weeks IUP here with report of gross rupture of membranes at 0240 this am.  Also reports feeling irregular contractions since.  Pt prefers to have as "natural" of labor as possible.  Declines cytotec at this time.  Pt received prenatal care at [facility] beginning at [redacted] wks gestation.  Pregnancy is dated by 8 week ultrasound.  Pt denies any problems during pregnancy.  OB Hx significant for 1.5 min shoulder dystocia of 7 lb 10 oz baby with last delivery.    Past Medical History  Diagnosis Date  . No pertinent past medical history   . Abnormal pap   . Pneumonia     2013  . Abnormal Pap smear     LEEP 2011    Past Surgical History  Procedure Laterality Date  . Leep    . Mouth surgery  2014    wisdom teeth removed    Family History  Problem Relation Age of Onset  . Lung cancer Paternal Grandfather     60s  . Breast cancer Paternal Grandmother     60s  . Breast cancer Paternal Aunt   . Colon cancer Father     age 55 diagnosed, succumbed at age 60  . Hypertension Mother   . Thyroid disease Mother   . Colon cancer Sister     Three tumors found at time of colonoscopy. age 36.  . Breast cancer Other     aunt  . Diabetes Maternal Grandmother   . Diabetes Maternal Grandfather     History  Substance Use Topics  . Smoking status: Never Smoker   . Smokeless tobacco: Never Used  . Alcohol Use: No    Allergies: No Known Allergies  Prescriptions prior to admission  Medication Sig Dispense Refill  . prenatal vitamin w/FE, FA (PRENATAL 1 + 1) 27-1 MG TABS Take 1 tablet by mouth daily.  30 each  5    Review of Systems  Gastrointestinal: Positive for abdominal pain (contractions).  Genitourinary:       Rupture of membranes  All other systems reviewed and are negative.   Physical Exam    Blood pressure 104/59, pulse 99, temperature 98.2 F (36.8 C), temperature source Oral, resp. rate 18, height 5' 3" (1.6 m), weight 89.812 kg (198 lb), last menstrual period 09/23/2012.  Physical Exam  Constitutional: She is oriented to person, place, and time. She appears well-developed and well-nourished. No distress.  HENT:  Head: Normocephalic.  Neck: Normal range of motion. Neck supple.  Cardiovascular: Normal rate, regular rhythm and normal heart sounds.   Respiratory: Effort normal and breath sounds normal.  GI: Soft. There is no tenderness.  EFW 7-7.5 lbs  Genitourinary: No bleeding around the vagina. Vaginal discharge (clear, watery discharge) found.  Neurological: She is alert and oriented to person, place, and time.  Skin: Skin is warm and dry.   FHR 130's, +accels, reactive Toco - irregular MAU Course  Procedures Results for orders placed during the hospital encounter of 07/11/13 (from the past 24 hour(s))  AMNISURE RUPTURE OF MEMBRANE (ROM)     Status: None   Collection Time    07/11/13  6:44 AM      Result Value Range   Amnisure ROM POSITIVE      OB RESULTS CONSOLE GC/CHLAMYDIA     Status: None   Collection Time    07/11/13  7:51 AM      Result Value Range   Gonorrhea Negative     Chlamydia Negative       Assessment and Plan  27 yo G2P1001 at [redacted]w[redacted]d wks IUP Premature Rupture of Membranes GBS negative Hx of Shoulder Dystocia  Plan:  Admit to Birthing Suites Pt will decide if would like to proceed with foley bulb Anticipate NSVD  MUHAMMAD,Dorethea Strubel 07/11/2013, 9:11 AM   

## 2013-07-11 NOTE — H&P (Signed)
History     CSN: [id number]  Arrival date and time: 07/11/13 8295   None     Chief Complaint  Patient presents with  . Rupture of Membranes   HPI  Pt is a 27 yo G2P1001 at [redacted]w[redacted]d weeks IUP here with report of gross rupture of membranes at 0240 this am.  Also reports feeling irregular contractions since.  Pt prefers to have as "natural" of labor as possible.  Declines cytotec at this time.  Pt received prenatal care at [facility] beginning at [redacted] wks gestation.  Pregnancy is dated by 8 week ultrasound.  Pt denies any problems during pregnancy.  OB Hx significant for 1.5 min shoulder dystocia of 7 lb 10 oz baby with last delivery.    Past Medical History  Diagnosis Date  . No pertinent past medical history   . Abnormal pap   . Pneumonia     2013  . Abnormal Pap smear     LEEP 2011    Past Surgical History  Procedure Laterality Date  . Leep    . Mouth surgery  2014    wisdom teeth removed    Family History  Problem Relation Age of Onset  . Lung cancer Paternal Grandfather     25s  . Breast cancer Paternal Grandmother     59s  . Breast cancer Paternal Aunt   . Colon cancer Father     age 42 diagnosed, succumbed at age 61  . Hypertension Mother   . Thyroid disease Mother   . Colon cancer Sister     Three tumors found at time of colonoscopy. age 56.  . Breast cancer Other     aunt  . Diabetes Maternal Grandmother   . Diabetes Maternal Grandfather     History  Substance Use Topics  . Smoking status: Never Smoker   . Smokeless tobacco: Never Used  . Alcohol Use: No    Allergies: No Known Allergies  Prescriptions prior to admission  Medication Sig Dispense Refill  . prenatal vitamin w/FE, FA (PRENATAL 1 + 1) 27-1 MG TABS Take 1 tablet by mouth daily.  30 each  5    Review of Systems  Gastrointestinal: Positive for abdominal pain (contractions).  Genitourinary:       Rupture of membranes  All other systems reviewed and are negative.   Physical Exam    Blood pressure 104/59, pulse 99, temperature 98.2 F (36.8 C), temperature source Oral, resp. rate 18, height 5\' 3"  (1.6 m), weight 89.812 kg (198 lb), last menstrual period 09/23/2012.  Physical Exam  Constitutional: She is oriented to person, place, and time. She appears well-developed and well-nourished. No distress.  HENT:  Head: Normocephalic.  Neck: Normal range of motion. Neck supple.  Cardiovascular: Normal rate, regular rhythm and normal heart sounds.   Respiratory: Effort normal and breath sounds normal.  GI: Soft. There is no tenderness.  EFW 7-7.5 lbs  Genitourinary: No bleeding around the vagina. Vaginal discharge (clear, watery discharge) found.  Neurological: She is alert and oriented to person, place, and time.  Skin: Skin is warm and dry.   FHR 130's, +accels, reactive Toco - irregular MAU Course  Procedures Results for orders placed during the hospital encounter of 07/11/13 (from the past 24 hour(s))  AMNISURE RUPTURE OF MEMBRANE (ROM)     Status: None   Collection Time    07/11/13  6:44 AM      Result Value Range   Amnisure ROM POSITIVE  OB RESULTS CONSOLE GC/CHLAMYDIA     Status: None   Collection Time    07/11/13  7:51 AM      Result Value Range   Gonorrhea Negative     Chlamydia Negative       Assessment and Plan  27 yo G2P1001 at [redacted]w[redacted]d wks IUP Premature Rupture of Membranes GBS negative Hx of Shoulder Dystocia  Plan:  Admit to Birthing Suites Pt will decide if would like to proceed with foley bulb Anticipate NSVD  [facility] 07/11/2013, 9:11 AM

## 2013-07-11 NOTE — Progress Notes (Signed)
Elizabeth Horton is a 27 y.o. G2P1001 at [redacted]w[redacted]d  admitted for PROM  Subjective:  Pt has been waiting all day for labor to start but still not feeling contractions. Wanting to try a foley bulb now.  +FM.  Objective: BP 110/75  Pulse 89  Temp(Src) 98.3 F (36.8 C) (Oral)  Resp 18  Ht 5\' 3"  (1.6 m)  Wt 89.812 kg (198 lb)  BMI 35.08 kg/m2  LMP 09/23/2012      FHT:  FHR: 140 bpm, variability: moderate,  accelerations:  Present,  decelerations:  Absent UC:   none SVE:   Dilation: 1.5 Effacement (%): 60 Station: -3 Exam by:: dr. Reola Calkins  Labs: Lab Results  Component Value Date   WBC 9.1 07/11/2013   HGB 11.6* 07/11/2013   HCT 34.7* 07/11/2013   MCV 82.8 07/11/2013   PLT 253 07/11/2013    Assessment / Plan: PROM with expectant management. now willing to start induction  Labor: FB placed without difficulty and inflated with 60cc of LR Fetal Wellbeing:  Category I Pain Control:  Labor support without medications I/D:  n/a Anticipated MOD:  NSVD  Aimar Shrewsbury L 07/11/2013, 10:27 PM

## 2013-07-11 NOTE — MAU Note (Signed)
Pt taken to MAU bathroom to change. Throughput to birthing suites for gross rupture. Not triaged.

## 2013-07-11 NOTE — Progress Notes (Signed)
Patient ID: Elizabeth Horton, female   DOB: 1986-04-02, 27 y.o.   MRN: [id number]  Doing well but UCs not increasing in frequency or strength much  Filed Vitals:   07/11/13 1206 07/11/13 1253 07/11/13 1324 07/11/13 1440  BP:   106/66   Pulse:   82   Temp: 98.1 F (36.7 C)  97.2 F (36.2 C)   TempSrc: Oral  Axillary   Resp: 18 18 18 18   Height:      Weight:       FHR reassuring UCs irregular   cervix deferred  Discussed options again. Thinking about foley but wants to try nipple stim first

## 2013-07-11 NOTE — Progress Notes (Signed)
Elizabeth Horton is a 27 y.o. G2P1001 at [redacted]w[redacted]d by ultrasound admitted for rupture of membranes  Subjective: Still wanting to wait for labor to come naturally. States wants to give it some more time. Not hurting much.   Objective: BP 101/66  Pulse 85  Temp(Src) 98.1 F (36.7 C) (Oral)  Resp 18  Ht 5\' 3"  (1.6 m)  Wt 89.812 kg (198 lb)  BMI 35.08 kg/m2  LMP 09/23/2012      FHT:  FHR: 140 bpm, variability: moderate,  accelerations:  Present,  decelerations:  Absent UC:   irregular, every 6-8 minutes SVE:   Dilation: 1 Effacement (%): Thick Station: -3 Exam by:: Roney Marion, CNM  Labs: Lab Results  Component Value Date   WBC 9.1 07/11/2013   HGB 11.6* 07/11/2013   HCT 34.7* 07/11/2013   MCV 82.8 07/11/2013   PLT 253 07/11/2013    Assessment / Plan: Protracted latent phase  Labor: Slow prograssion of labor, PROM at term x 9 hours Preeclampsia:  n/a Fetal Wellbeing:  Category I Pain Control:  Labor support without medications I/D:  n/a Anticipated MOD:  NSVD  Discussed options again, including expectant mgmt., foley balloon, cytotec and pitocin. Discussed risks of infection.  Declines interventions.   [facility] 07/11/2013, 12:09 PM

## 2013-07-12 ENCOUNTER — Encounter: Payer: Medicaid Other | Admitting: Women's Health

## 2013-07-12 ENCOUNTER — Encounter (HOSPITAL_COMMUNITY): Payer: Self-pay | Admitting: Anesthesiology

## 2013-07-12 ENCOUNTER — Inpatient Hospital Stay (HOSPITAL_COMMUNITY): Payer: Medicaid Other | Admitting: Anesthesiology

## 2013-07-12 ENCOUNTER — Encounter (HOSPITAL_COMMUNITY)
Admission: AD | Disposition: A | Discharge status: Home / Self Care | Payer: Self-pay | Source: Ambulatory Visit | Attending: Obstetrics & Gynecology

## 2013-07-12 ENCOUNTER — Encounter (HOSPITAL_COMMUNITY): Payer: Medicaid Other | Admitting: Anesthesiology

## 2013-07-12 SURGERY — Surgical Case
Anesthesia: Epidural | Site: Abdomen

## 2013-07-12 MED ORDER — EPHEDRINE 5 MG/ML INJ
10.0000 mg | INTRAVENOUS | Status: DC | PRN
  Filled 2013-07-12: qty 4

## 2013-07-12 MED ORDER — MAGNESIUM HYDROXIDE 400 MG/5ML PO SUSP: 30.0000 mL | ORAL | Status: DC | PRN

## 2013-07-12 MED ORDER — SCOPOLAMINE 1 MG/3DAYS TD PT72
MEDICATED_PATCH | TRANSDERMAL | Status: AC
  Filled 2013-07-12: qty 1

## 2013-07-12 MED ORDER — DIPHENHYDRAMINE HCL 50 MG/ML IJ SOLN: 12.5000 mg | INTRAMUSCULAR | Status: DC | PRN

## 2013-07-12 MED ORDER — WITCH HAZEL-GLYCERIN EX PADS: 1.0000 "application " | MEDICATED_PAD | CUTANEOUS | Status: DC | PRN

## 2013-07-12 MED ORDER — MORPHINE SULFATE 0.5 MG/ML IJ SOLN
INTRAMUSCULAR | Status: AC
  Filled 2013-07-12: qty 10

## 2013-07-12 MED ORDER — TERBUTALINE SULFATE 1 MG/ML IJ SOLN: 0.2500 mg | Freq: Once | INTRAMUSCULAR | Status: DC | PRN

## 2013-07-12 MED ORDER — LACTATED RINGERS IV SOLN
INTRAVENOUS | Status: DC | PRN
  Administered 2013-07-12: 15:00:00 via INTRAVENOUS

## 2013-07-12 MED ORDER — FENTANYL CITRATE 0.05 MG/ML IJ SOLN
25.0000 ug | INTRAMUSCULAR | Status: DC | PRN
  Administered 2013-07-12: 50 ug via INTRAVENOUS

## 2013-07-12 MED ORDER — SIMETHICONE 80 MG PO CHEW
80.0000 mg | CHEWABLE_TABLET | ORAL | Status: DC | PRN
  Administered 2013-07-13: 80 mg via ORAL

## 2013-07-12 MED ORDER — PRENATAL MULTIVITAMIN CH
1.0000 | ORAL_TABLET | Freq: Every day | ORAL | Status: DC
  Administered 2013-07-13 – 2013-07-15 (×3): 1 via ORAL
  Filled 2013-07-12 (×3): qty 1

## 2013-07-12 MED ORDER — FENTANYL 2.5 MCG/ML BUPIVACAINE 1/10 % EPIDURAL INFUSION (WH - ANES)
14.0000 mL/h | INTRAMUSCULAR | Status: DC | PRN
  Administered 2013-07-12 (×2): 14 mL/h via EPIDURAL
  Filled 2013-07-12 (×3): qty 125

## 2013-07-12 MED ORDER — KETOROLAC TROMETHAMINE 30 MG/ML IJ SOLN
INTRAMUSCULAR | Status: AC
  Filled 2013-07-12: qty 1

## 2013-07-12 MED ORDER — OXYCODONE-ACETAMINOPHEN 5-325 MG PO TABS
1.0000 | ORAL_TABLET | ORAL | Status: DC | PRN
  Administered 2013-07-13: 1 via ORAL
  Administered 2013-07-14 – 2013-07-15 (×8): 2 via ORAL
  Filled 2013-07-12: qty 1
  Filled 2013-07-12 (×8): qty 2

## 2013-07-12 MED ORDER — CEFAZOLIN SODIUM-DEXTROSE 2-3 GM-% IV SOLR
INTRAVENOUS | Status: AC
  Filled 2013-07-12: qty 50

## 2013-07-12 MED ORDER — PHENYLEPHRINE 40 MCG/ML (10ML) SYRINGE FOR IV PUSH (FOR BLOOD PRESSURE SUPPORT)
PREFILLED_SYRINGE | INTRAVENOUS | Status: AC
  Filled 2013-07-12: qty 5

## 2013-07-12 MED ORDER — PHENYLEPHRINE HCL 10 MG/ML IJ SOLN
INTRAMUSCULAR | Status: DC | PRN
  Administered 2013-07-12 (×2): 80 ug via INTRAVENOUS

## 2013-07-12 MED ORDER — MEASLES, MUMPS & RUBELLA VAC ~~LOC~~ INJ: 0.5000 mL | INJECTION | Freq: Once | SUBCUTANEOUS | Status: DC

## 2013-07-12 MED ORDER — NALOXONE HCL 0.4 MG/ML IJ SOLN: 0.4000 mg | INTRAMUSCULAR | Status: DC | PRN

## 2013-07-12 MED ORDER — LACTATED RINGERS IV SOLN
INTRAVENOUS | Status: DC
  Administered 2013-07-12: via INTRAVENOUS

## 2013-07-12 MED ORDER — LANOLIN HYDROUS EX OINT: 1.0000 "application " | TOPICAL_OINTMENT | CUTANEOUS | Status: DC | PRN

## 2013-07-12 MED ORDER — LACTATED RINGERS IV SOLN
INTRAVENOUS | Status: DC | PRN
  Administered 2013-07-12 (×3): via INTRAVENOUS

## 2013-07-12 MED ORDER — ONDANSETRON HCL 4 MG PO TABS: 4.0000 mg | ORAL_TABLET | ORAL | Status: DC | PRN

## 2013-07-12 MED ORDER — ONDANSETRON HCL 4 MG/2ML IJ SOLN
INTRAMUSCULAR | Status: AC
  Filled 2013-07-12: qty 2

## 2013-07-12 MED ORDER — FENTANYL 2.5 MCG/ML BUPIVACAINE 1/10 % EPIDURAL INFUSION (WH - ANES)
INTRAMUSCULAR | Status: DC | PRN
  Administered 2013-07-12: 14 mL/h via EPIDURAL

## 2013-07-12 MED ORDER — PHENYLEPHRINE 40 MCG/ML (10ML) SYRINGE FOR IV PUSH (FOR BLOOD PRESSURE SUPPORT)
80.0000 ug | PREFILLED_SYRINGE | INTRAVENOUS | Status: DC | PRN
  Filled 2013-07-12: qty 10

## 2013-07-12 MED ORDER — ONDANSETRON HCL 4 MG/2ML IJ SOLN: 4.0000 mg | Freq: Three times a day (TID) | INTRAMUSCULAR | Status: DC | PRN

## 2013-07-12 MED ORDER — LIDOCAINE HCL (PF) 1 % IJ SOLN
INTRAMUSCULAR | Status: DC | PRN
  Administered 2013-07-12 (×2): 9 mL

## 2013-07-12 MED ORDER — MORPHINE SULFATE (PF) 0.5 MG/ML IJ SOLN
INTRAMUSCULAR | Status: DC | PRN
  Administered 2013-07-12: 1 mg via INTRAVENOUS
  Administered 2013-07-12: 3 mg via EPIDURAL

## 2013-07-12 MED ORDER — CEFAZOLIN SODIUM-DEXTROSE 2-3 GM-% IV SOLR
INTRAVENOUS | Status: DC | PRN
  Administered 2013-07-12: 2 g via INTRAVENOUS

## 2013-07-12 MED ORDER — ONDANSETRON HCL 4 MG/2ML IJ SOLN
INTRAMUSCULAR | Status: DC | PRN
  Administered 2013-07-12: 4 mg via INTRAVENOUS

## 2013-07-12 MED ORDER — OXYTOCIN 40 UNITS IN LACTATED RINGERS INFUSION - SIMPLE MED: 62.5000 mL/h | INTRAVENOUS | Status: AC

## 2013-07-12 MED ORDER — ACETAMINOPHEN 500 MG PO TABS: 1000.0000 mg | ORAL_TABLET | Freq: Four times a day (QID) | ORAL | Status: DC

## 2013-07-12 MED ORDER — NALBUPHINE HCL 10 MG/ML IJ SOLN: 5.0000 mg | INTRAMUSCULAR | Status: DC | PRN

## 2013-07-12 MED ORDER — MENTHOL 3 MG MT LOZG: 1.0000 | LOZENGE | OROMUCOSAL | Status: DC | PRN

## 2013-07-12 MED ORDER — OXYTOCIN 10 UNIT/ML IJ SOLN
INTRAMUSCULAR | Status: AC
  Filled 2013-07-12: qty 4

## 2013-07-12 MED ORDER — BUPIVACAINE HCL (PF) 0.5 % IJ SOLN
INTRAMUSCULAR | Status: DC | PRN
  Administered 2013-07-12: 30 mL

## 2013-07-12 MED ORDER — DIPHENHYDRAMINE HCL 50 MG/ML IJ SOLN
12.5000 mg | INTRAMUSCULAR | Status: DC | PRN
  Administered 2013-07-12: 12.5 mg via INTRAVENOUS

## 2013-07-12 MED ORDER — OXYTOCIN 10 UNIT/ML IJ SOLN
40.0000 [IU] | INTRAVENOUS | Status: DC | PRN
  Administered 2013-07-12: 40 [IU] via INTRAVENOUS

## 2013-07-12 MED ORDER — IBUPROFEN 600 MG PO TABS: 600.0000 mg | ORAL_TABLET | Freq: Four times a day (QID) | ORAL | Status: DC | PRN

## 2013-07-12 MED ORDER — SODIUM BICARBONATE 8.4 % IV SOLN
INTRAVENOUS | Status: DC | PRN
  Administered 2013-07-12: 10 mL via EPIDURAL
  Administered 2013-07-12: 3 mL via EPIDURAL

## 2013-07-12 MED ORDER — DIPHENHYDRAMINE HCL 25 MG PO CAPS
25.0000 mg | ORAL_CAPSULE | ORAL | Status: DC | PRN
  Filled 2013-07-12: qty 1

## 2013-07-12 MED ORDER — MEPERIDINE HCL 25 MG/ML IJ SOLN: 6.2500 mg | INTRAMUSCULAR | Status: DC | PRN

## 2013-07-12 MED ORDER — MEPERIDINE HCL 25 MG/ML IJ SOLN
6.2500 mg | INTRAMUSCULAR | Status: DC | PRN
  Administered 2013-07-12: 12.5 mg via INTRAVENOUS

## 2013-07-12 MED ORDER — PIPERACILLIN-TAZOBACTAM 3.375 G IVPB
3.3750 g | Freq: Three times a day (TID) | INTRAVENOUS | Status: AC
  Administered 2013-07-12 – 2013-07-13 (×3): 3.375 g via INTRAVENOUS
  Filled 2013-07-12 (×3): qty 50

## 2013-07-12 MED ORDER — OXYTOCIN 40 UNITS IN LACTATED RINGERS INFUSION - SIMPLE MED
1.0000 m[IU]/min | INTRAVENOUS | Status: DC
  Administered 2013-07-12: 2 m[IU]/min via INTRAVENOUS
  Filled 2013-07-12: qty 1000

## 2013-07-12 MED ORDER — BUPIVACAINE HCL (PF) 0.5 % IJ SOLN
INTRAMUSCULAR | Status: AC
  Filled 2013-07-12: qty 30

## 2013-07-12 MED ORDER — DIPHENHYDRAMINE HCL 50 MG/ML IJ SOLN: 25.0000 mg | INTRAMUSCULAR | Status: DC | PRN

## 2013-07-12 MED ORDER — MIDAZOLAM HCL 2 MG/2ML IJ SOLN: 0.5000 mg | Freq: Once | INTRAMUSCULAR | Status: DC | PRN

## 2013-07-12 MED ORDER — KETOROLAC TROMETHAMINE 30 MG/ML IJ SOLN: 30.0000 mg | Freq: Four times a day (QID) | INTRAMUSCULAR | Status: DC | PRN

## 2013-07-12 MED ORDER — LACTATED RINGERS IV SOLN: 500.0000 mL | Freq: Once | INTRAVENOUS | Status: DC

## 2013-07-12 MED ORDER — NALBUPHINE HCL 10 MG/ML IJ SOLN
5.0000 mg | INTRAMUSCULAR | Status: DC | PRN
  Administered 2013-07-12: 10 mg via SUBCUTANEOUS
  Filled 2013-07-12: qty 1

## 2013-07-12 MED ORDER — SODIUM CHLORIDE 0.9 % IJ SOLN: 3.0000 mL | INTRAMUSCULAR | Status: DC | PRN

## 2013-07-12 MED ORDER — MEPERIDINE HCL 25 MG/ML IJ SOLN
INTRAMUSCULAR | Status: AC
  Filled 2013-07-12: qty 1

## 2013-07-12 MED ORDER — DIPHENHYDRAMINE HCL 50 MG/ML IJ SOLN
INTRAMUSCULAR | Status: AC
  Filled 2013-07-12: qty 1

## 2013-07-12 MED ORDER — SENNOSIDES-DOCUSATE SODIUM 8.6-50 MG PO TABS
2.0000 | ORAL_TABLET | ORAL | Status: DC
  Administered 2013-07-12 – 2013-07-14 (×3): 2 via ORAL
  Filled 2013-07-12 (×3): qty 2

## 2013-07-12 MED ORDER — ZOLPIDEM TARTRATE 5 MG PO TABS: 5.0000 mg | ORAL_TABLET | Freq: Every evening | ORAL | Status: DC | PRN

## 2013-07-12 MED ORDER — TETANUS-DIPHTH-ACELL PERTUSSIS 5-2.5-18.5 LF-MCG/0.5 IM SUSP: 0.5000 mL | Freq: Once | INTRAMUSCULAR | Status: DC

## 2013-07-12 MED ORDER — KETOROLAC TROMETHAMINE 30 MG/ML IJ SOLN
30.0000 mg | Freq: Four times a day (QID) | INTRAMUSCULAR | Status: DC | PRN
  Administered 2013-07-12: 30 mg via INTRAVENOUS

## 2013-07-12 MED ORDER — DIPHENHYDRAMINE HCL 25 MG PO CAPS: 25.0000 mg | ORAL_CAPSULE | Freq: Four times a day (QID) | ORAL | Status: DC | PRN

## 2013-07-12 MED ORDER — NALOXONE HCL 1 MG/ML IJ SOLN: 1.0000 ug/kg/h | INTRAMUSCULAR | Status: DC | PRN

## 2013-07-12 MED ORDER — METOCLOPRAMIDE HCL 5 MG/ML IJ SOLN: 10.0000 mg | Freq: Three times a day (TID) | INTRAMUSCULAR | Status: DC | PRN

## 2013-07-12 MED ORDER — PROMETHAZINE HCL 25 MG/ML IJ SOLN: 6.2500 mg | INTRAMUSCULAR | Status: DC | PRN

## 2013-07-12 MED ORDER — SCOPOLAMINE 1 MG/3DAYS TD PT72
1.0000 | MEDICATED_PATCH | Freq: Once | TRANSDERMAL | Status: DC
  Administered 2013-07-12: 1.5 mg via TRANSDERMAL

## 2013-07-12 MED ORDER — IBUPROFEN 600 MG PO TABS
600.0000 mg | ORAL_TABLET | Freq: Four times a day (QID) | ORAL | Status: DC
  Administered 2013-07-12 – 2013-07-15 (×10): 600 mg via ORAL
  Filled 2013-07-12 (×10): qty 1

## 2013-07-12 MED ORDER — METHYLERGONOVINE MALEATE 0.2 MG/ML IJ SOLN
INTRAMUSCULAR | Status: DC | PRN
  Administered 2013-07-12: 0.2 mg via INTRAMUSCULAR

## 2013-07-12 MED ORDER — CEFAZOLIN SODIUM-DEXTROSE 2-3 GM-% IV SOLR: 2.0000 g | Freq: Once | INTRAVENOUS | Status: DC

## 2013-07-12 MED ORDER — PHENYLEPHRINE 40 MCG/ML (10ML) SYRINGE FOR IV PUSH (FOR BLOOD PRESSURE SUPPORT): 80.0000 ug | PREFILLED_SYRINGE | INTRAVENOUS | Status: DC | PRN

## 2013-07-12 MED ORDER — ONDANSETRON HCL 4 MG/2ML IJ SOLN: 4.0000 mg | INTRAMUSCULAR | Status: DC | PRN

## 2013-07-12 MED ORDER — DIBUCAINE 1 % RE OINT: 1.0000 "application " | TOPICAL_OINTMENT | RECTAL | Status: DC | PRN

## 2013-07-12 MED ORDER — FENTANYL CITRATE 0.05 MG/ML IJ SOLN
INTRAMUSCULAR | Status: AC
  Filled 2013-07-12: qty 2

## 2013-07-12 MED ORDER — SIMETHICONE 80 MG PO CHEW
80.0000 mg | CHEWABLE_TABLET | ORAL | Status: DC
  Administered 2013-07-12 – 2013-07-14 (×3): 80 mg via ORAL
  Filled 2013-07-12 (×3): qty 1

## 2013-07-12 MED ORDER — LIDOCAINE-EPINEPHRINE (PF) 2 %-1:200000 IJ SOLN
INTRAMUSCULAR | Status: AC
  Filled 2013-07-12: qty 20

## 2013-07-12 MED ORDER — SODIUM BICARBONATE 8.4 % IV SOLN
INTRAVENOUS | Status: AC
  Filled 2013-07-12: qty 50

## 2013-07-12 MED ORDER — EPHEDRINE 5 MG/ML INJ: 10.0000 mg | INTRAVENOUS | Status: DC | PRN

## 2013-07-12 SURGICAL SUPPLY — 36 items
CLAMP CORD UMBIL (MISCELLANEOUS) IMPLANT
CLOTH BEACON ORANGE TIMEOUT ST (SAFETY) ×2 IMPLANT
DRAPE LG THREE QUARTER DISP (DRAPES) IMPLANT
DRSG OPSITE POSTOP 4X10 (GAUZE/BANDAGES/DRESSINGS) IMPLANT
DRSG TELFA 3X8 NADH (GAUZE/BANDAGES/DRESSINGS) ×2 IMPLANT
DURAPREP 26ML APPLICATOR (WOUND CARE) ×2 IMPLANT
ELECT REM PT RETURN 9FT ADLT (ELECTROSURGICAL) ×2
ELECTRODE REM PT RTRN 9FT ADLT (ELECTROSURGICAL) ×1 IMPLANT
EXTRACTOR VACUUM M CUP 4 TUBE (SUCTIONS) IMPLANT
GLOVE BIO SURGEON STRL SZ7 (GLOVE) ×4 IMPLANT
GLOVE BIOGEL PI IND STRL 7.0 (GLOVE) ×1 IMPLANT
GLOVE BIOGEL PI INDICATOR 7.0 (GLOVE) ×1
GOWN PREVENTION PLUS XLARGE (GOWN DISPOSABLE) ×2 IMPLANT
GOWN STRL REIN XL XLG (GOWN DISPOSABLE) ×2 IMPLANT
HEMOSTAT SURGICEL 2X3 (HEMOSTASIS) ×2 IMPLANT
KIT ABG SYR 3ML LUER SLIP (SYRINGE) IMPLANT
NEEDLE HYPO 22GX1.5 SAFETY (NEEDLE) ×2 IMPLANT
NEEDLE HYPO 25X5/8 SAFETYGLIDE (NEEDLE) ×2 IMPLANT
NS IRRIG 1000ML POUR BTL (IV SOLUTION) ×2 IMPLANT
PACK C SECTION WH (CUSTOM PROCEDURE TRAY) ×2 IMPLANT
PAD ABD 7.5X8 STRL (GAUZE/BANDAGES/DRESSINGS) ×2 IMPLANT
PAD OB MATERNITY 4.3X12.25 (PERSONAL CARE ITEMS) ×2 IMPLANT
RTRCTR C-SECT PINK 25CM LRG (MISCELLANEOUS) ×2 IMPLANT
SPONGE GAUZE 4X4 12PLY STER LF (GAUZE/BANDAGES/DRESSINGS) ×2 IMPLANT
STAPLER VISISTAT 35W (STAPLE) IMPLANT
SUT PDS AB 0 CT1 27 (SUTURE) IMPLANT
SUT PDS AB 0 CTX 36 PDP370T (SUTURE) IMPLANT
SUT PLAIN 2 0 XLH (SUTURE) IMPLANT
SUT VIC AB 0 CT1 36 (SUTURE) ×4 IMPLANT
SUT VIC AB 0 CTX 36 (SUTURE) ×2
SUT VIC AB 0 CTX36XBRD ANBCTRL (SUTURE) ×2 IMPLANT
SUT VIC AB 4-0 KS 27 (SUTURE) ×2 IMPLANT
SYR 30ML LL (SYRINGE) ×2 IMPLANT
TOWEL OR 17X24 6PK STRL BLUE (TOWEL DISPOSABLE) ×2 IMPLANT
TRAY FOLEY CATH 14FR (SET/KITS/TRAYS/PACK) ×2 IMPLANT
WATER STERILE IRR 1000ML POUR (IV SOLUTION) IMPLANT

## 2013-07-12 NOTE — Progress Notes (Signed)
Elizabeth Horton is a 27 y.o. G2P1001 at [redacted]w[redacted]d admitted for rupture of membranes  Subjective: More and more uncomfortable. Wanting an epidural now. +FM.   Objective: BP 119/79  Pulse 99  Temp(Src) 98.3 F (36.8 C) (Oral)  Resp 20  Ht 5\' 3"  (1.6 m)  Wt 89.812 kg (198 lb)  BMI 35.08 kg/m2  SpO2 98%  LMP 09/23/2012      FHT:  FHR: 140 bpm, variability: moderate,  accelerations:  Present,  decelerations:  Absent UC:   regular, every 2-4 minutes SVE:   Dilation: 6.5 Effacement (%): 50 Station: -2 Exam by:: dr. Reola Calkins  Labs: Lab Results  Component Value Date   WBC 9.1 07/11/2013   HGB 11.6* 07/11/2013   HCT 34.7* 07/11/2013   MCV 82.8 07/11/2013   PLT 253 07/11/2013    Assessment / Plan: IOL due to PROM s/p FB adn on pit  Labor: progressing on pitocin. now of 29mu/min Fetal Wellbeing:  Category I Pain Control:  Epidural I/D:  n/a Anticipated MOD:  NSVD  Elizabeth Horton 07/12/2013, 3:54 AM

## 2013-07-12 NOTE — Progress Notes (Signed)
9:02 PM Filed Vitals:   07/12/13 2025  BP: 132/72  Pulse: 84  Temp: 100.5 F (38.1 C)  Resp: 16   Zosyn ordered IV for 24 hours CRESENZO-DISHMAN,Ajanee Buren

## 2013-07-12 NOTE — Progress Notes (Signed)
Elizabeth Horton is a 27 y.o. G2P1001 at [redacted]w[redacted]d by ultrasound admitted for rupture of membranes  Subjective: Doing well, feeling a little bit of pressure.  Objective: BP 140/87  Pulse 91  Temp(Src) 98.2 F (36.8 C) (Oral)  Resp 18  Ht 5\' 3"  (1.6 m)  Wt 89.812 kg (198 lb)  BMI 35.08 kg/m2  SpO2 100%  LMP 09/23/2012      FHT:  FHR: 135 bpm, variability: moderate,  accelerations:  Present,  decelerations:  Absent UC:   regular, every 2 minutes SVE:   Dilation: 8.5 Effacement (%): 90 Station: -2 Exam by:: hayes  Labs: Lab Results  Component Value Date   WBC 9.1 07/11/2013   HGB 11.6* 07/11/2013   HCT 34.7* 07/11/2013   MCV 82.8 07/11/2013   PLT 253 07/11/2013    Assessment / Plan: Augmentation of labor, progressing well  Labor: Progressing normally Preeclampsia:  n/a Fetal Wellbeing:  Category I Pain Control:  Epidural I/D:  n/a Anticipated MOD:  NSVD  WILLIAMS,MARIE 07/12/2013, 9:21 AM

## 2013-07-12 NOTE — Op Note (Signed)
Elizabeth Horton PROCEDURE DATE: 07/12/2013  PREOPERATIVE DIAGNOSES: Intrauterine pregnancy at [redacted]w[redacted]d weeks gestation; failure to progress: arrest of descent and failure to progress: arrest of dilation  POSTOPERATIVE DIAGNOSES: The same  PROCEDURE: Primary Low Transverse Cesarean Section  SURGEON:  Dr. Jaynie Collins  ANESTHESIOLOGIST: Dr. Brayton Caves  INDICATIONS: Elizabeth Horton is a 27 y.o. G2P1001 at [redacted]w[redacted]d here for cesarean section secondary to the indications listed under preoperative diagnoses; please see preoperative note for further details.  The risks of cesarean section were discussed with the patient including but were not limited to: bleeding which may require transfusion or reoperation; infection which may require antibiotics; injury to bowel, bladder, ureters or other surrounding organs; injury to the fetus; need for additional procedures including hysterectomy in the event of a life-threatening hemorrhage; placental abnormalities wth subsequent pregnancies, incisional problems, thromboembolic phenomenon and other postoperative/anesthesia complications.   The patient concurred with the proposed plan, giving informed written consent for the procedure.    FINDINGS:  Viable female infant in cephalic, direct occiput posterior presentation.  Apgars 9 and 9.  Clear amniotic fluid.  Intact placenta, three vessel cord.  Normal uterus, fallopian tubes and ovaries bilaterally.  There was significant uterine atony noted during the case that resulted in increased bleeding, patient received IV Pitocin and one dose of IM Methergine.  ANESTHESIA: Epidural INTRAVENOUS FLUIDS: 3500 ml ESTIMATED BLOOD LOSS: 1000 ml URINE OUTPUT:  150 ml SPECIMENS: Placenta sent to L&D COMPLICATIONS: None immediate  PROCEDURE IN DETAIL:  The patient preoperatively received intravenous antibiotics and had sequential compression devices applied to her lower extremities.  She was then taken to the operating  room where the epidural anesthesia was dosed up to surgical level and was found to be adequate. She was then placed in a dorsal supine position with a leftward tilt, and prepped and draped in a sterile manner.  A foley catheter was placed into her bladder and attached to constant gravity.  After an adequate timeout was performed, a Pfannenstiel skin incision was made with scalpel and carried through to the underlying layer of fascia. The fascia was incised in the midline, and this incision was extended bilaterally using the Mayo scissors.  Kocher clamps were applied to the superior aspect of the fascial incision and the underlying rectus muscles were dissected off bluntly. A similar process was carried out on the inferior aspect of the fascial incision. The rectus muscles were separated in the midline bluntly and the peritoneum was entered bluntly. Attention was turned to the lower uterine segment where a low transverse hysterotomy was made with a scalpel and extended bilaterally bluntly.  The infant was successfully delivered, the cord was clamped and cut and the infant was handed over to awaiting neonatology team. Uterine massage was then administered, and the placenta delivered intact with a three-vessel cord. The uterus was then cleared of clot and debris.  The hysterotomy was closed with 0 Vicryl in a running locked fashion, and an imbricating layer was also placed with 0 Vicryl. The pelvis was cleared of all clot and debris.  There was some some uterine atony which was treated with Pitocin and Methergine.  Hemostasis was confirmed on all surfaces.  The peritoneum and the muscles were reapproximated using 0 Vicryl interrupted stitches. The fascia was then closed using 0 Vicryl in a running fashion.  The subcutaneous layer was irrigated and 30 ml of 0.5% Marcaine was injected subcutaneously around the incision.  The skin was closed with a 4-0 Vicryl subcuticular stitch. The  patient tolerated the procedure well.  Sponge, lap, instrument and needle counts were correct x 2.  She was taken to the recovery room in stable condition.   Jaynie Collins, MD, FACOG Attending Obstetrician & Gynecologist Faculty Practice, [facility] [place]

## 2013-07-12 NOTE — Progress Notes (Signed)
Elizabeth Horton is a 28 y.o. G2P1001 at [redacted]w[redacted]d  admitted for rupture of membranes  Subjective:  Now 27 hours post rupture. S/p epidural. Feeling some better. No pressure. +FM. +LOF  Objective: BP 130/63  Pulse 105  Temp(Src) 98.4 F (36.9 C) (Oral)  Resp 18  Ht 5\' 3"  (1.6 m)  Wt 89.812 kg (198 lb)  BMI 35.08 kg/m2  SpO2 100%  LMP 09/23/2012      FHT:  FHR: 130 bpm, variability: moderate,  accelerations:  Present,  decelerations:  Absent UC:   regular, every 1-2 minutes SVE:   Dilation: 7.5 Effacement (%): 80 Station: -3 Exam by:: dr. Reola Horton  Labs: Lab Results  Component Value Date   WBC 9.1 07/11/2013   HGB 11.6* 07/11/2013   HCT 34.7* 07/11/2013   MCV 82.8 07/11/2013   PLT 253 07/11/2013    Assessment / Plan: IOL for PROM, now on pitocin at 81mu/min  Labor: Progressing normally Fetal Wellbeing:  Category I Pain Control:  Epidural I/D:  n/a Anticipated MOD:  NSVD  Elizabeth Horton 07/12/2013, 6:41 AM

## 2013-07-12 NOTE — Progress Notes (Signed)
Elizabeth Horton is a 27 y.o. G2P1001 at [redacted]w[redacted]d by ultrasound admitted for rupture of membranes  Subjective: No complaints. Worried that labor is not progressing.  Objective: BP 118/84  Pulse 133  Temp(Src) 99.6 F (37.6 C) (Oral)  Resp 20  Ht 5\' 3"  (1.6 m)  Wt 198 lb (89.812 kg)  BMI 35.08 kg/m2  SpO2 100%  LMP 09/23/2012   Total I/O In: -  Out: 250 [Urine:250]  FHT:  FHR: 135 bpm, variability: moderate,  accelerations:  Present,  decelerations:  variable UC:   regular, every 2 minutes  MVU 130-200, was adequate for over an hour.  Pitocin was turned off secondary to late decelerations. SVE:   Dilation: Lip/rim Effacement (%): 90 Station: -1 Exam by:: Sherrell Farish No change since 0933  Labs: Lab Results  Component Value Date   WBC 9.1 07/11/2013   HGB 11.6* 07/11/2013   HCT 34.7* 07/11/2013   MCV 82.8 07/11/2013   PLT 253 07/11/2013    Assessment / Plan: Prolonged second stage of labor, arrest of dilatation and descent despite adequate contractions, fetal intolerance to increasing pitocin.  Cesarean delivery recommended.   The risks of cesarean section discussed with the patient included but were not limited to: bleeding which may require transfusion or reoperation; infection which may require antibiotics; injury to bowel, bladder, ureters or other surrounding organs; injury to the fetus; need for additional procedures including hysterectomy in the event of a life-threatening hemorrhage; placental abnormalities wth subsequent pregnancies, incisional problems, thromboembolic phenomenon and other postoperative/anesthesia complications. The patient concurred with the proposed plan, giving informed written consent for the procedure.   Anesthesia and OR aware. Preoperative prophylactic antibiotics and SCDs ordered on call to the OR.  To OR when ready.   Jaynie Collins, MD, FACOG Attending Obstetrician & Gynecologist Faculty Practice, [facility] [place]

## 2013-07-12 NOTE — Anesthesia Postprocedure Evaluation (Signed)
  Anesthesia Post Note  Patient: Elizabeth Horton  Procedure(s) Performed: Procedure(s) (LRB): CESAREAN SECTION (N/A)  Anesthesia type: Epidural  Patient location: PACU  Post pain: Pain level controlled  Post assessment: Post-op Vital signs reviewed  Last Vitals:  Filed Vitals:   07/12/13 1444  BP: 130/77  Pulse: 157  Temp:   Resp: 20    Post vital signs: Reviewed  Level of consciousness: awake  Complications: No apparent anesthesia complications

## 2013-07-12 NOTE — Anesthesia Procedure Notes (Signed)
Epidural Patient location during procedure: OB Start time: 07/12/2013 3:42 AM End time: 07/12/2013 3:46 AM  Staffing Anesthesiologist: Leilani Able Performed by: anesthesiologist   Preanesthetic Checklist Completed: patient identified, surgical consent, pre-op evaluation, timeout performed, IV checked, risks and benefits discussed and monitors and equipment checked  Epidural Patient position: sitting Prep: site prepped and draped and DuraPrep Patient monitoring: continuous pulse ox and blood pressure Approach: midline Injection technique: LOR air  Needle:  Needle type: Tuohy  Needle gauge: 17 G Needle length: 9 cm and 9 Needle insertion depth: 6 cm Catheter type: closed end flexible Catheter size: 19 Gauge Catheter at skin depth: 11 cm Test dose: negative and Other  Assessment Sensory level: T9 Events: blood not aspirated, injection not painful, no injection resistance, negative IV test and no paresthesia  Additional Notes Reason for block:procedure for pain

## 2013-07-12 NOTE — Anesthesia Preprocedure Evaluation (Signed)
Anesthesia Evaluation  Patient identified by MRN, date of birth, ID band Patient awake    Reviewed: Allergy & Precautions, H&P , NPO status , Patient's Chart, lab work & pertinent test results  Airway Mallampati: II TM Distance: >3 FB Neck ROM: full    Dental no notable dental hx. (+) Teeth Intact   Pulmonary    Pulmonary exam normal       Cardiovascular negative cardio ROS      Neuro/Psych negative neurological ROS  negative psych ROS   GI/Hepatic negative GI ROS, Neg liver ROS,   Endo/Other  negative endocrine ROS  Renal/GU negative Renal ROS     Musculoskeletal   Abdominal Normal abdominal exam  (+)   Peds  Hematology negative hematology ROS (+)   Anesthesia Other Findings   Reproductive/Obstetrics negative OB ROS (+) Pregnancy                           Anesthesia Physical Anesthesia Plan  ASA: II  Anesthesia Plan: Epidural   Post-op Pain Management:    Induction:   Airway Management Planned:   Additional Equipment:   Intra-op Plan:   Post-operative Plan:   Informed Consent: I have reviewed the patients History and Physical, chart, labs and discussed the procedure including the risks, benefits and alternatives for the proposed anesthesia with the patient or authorized representative who has indicated his/her understanding and acceptance.     Plan Discussed with:   Anesthesia Plan Comments:         Anesthesia Quick Evaluation

## 2013-07-12 NOTE — Progress Notes (Signed)
Elizabeth Horton is a 27 y.o. G2P1001 at [redacted]w[redacted]d  admitted for rupture of membranes  Subjective: ROM now 22 hours ago.  Foley bulb just came out. Feeling some mild cramping. +FM.   Objective: BP 109/68  Pulse 91  Temp(Src) 98.3 F (36.8 C) (Oral)  Resp 18  Ht 5\' 3"  (1.6 m)  Wt 89.812 kg (198 lb)  BMI 35.08 kg/m2  LMP 09/23/2012      FHT:  FHR: 135 bpm, variability: moderate,  accelerations:  Present,  decelerations:  Absent UC:   irregular, every 1-8 minutes SVE:   Dilation: 3.5 Effacement (%): 50 Station: -3 Exam by:: dr. Reola Calkins  Labs: Lab Results  Component Value Date   WBC 9.1 07/11/2013   HGB 11.6* 07/11/2013   HCT 34.7* 07/11/2013   MCV 82.8 07/11/2013   PLT 253 07/11/2013    Assessment / Plan: IOL for PROM, now s/p FB  Labor: start pitocin Fetal Wellbeing:  Category I Pain Control:  Labor support without medications I/D:  n/a Anticipated MOD:  NSVD  Kieron Kantner L 07/12/2013, 1:28 AM

## 2013-07-12 NOTE — Transfer of Care (Signed)
Immediate Anesthesia Transfer of Care Note  Patient: Elizabeth Horton  Procedure(s) Performed: Procedure(s): CESAREAN SECTION (N/A)  Patient Location: PACU  Anesthesia Type:Epidural  Level of Consciousness: awake, alert  and oriented  Airway & Oxygen Therapy: Patient Spontanous Breathing  Post-op Assessment: Report given to PACU RN and Post -op Vital signs reviewed and stable  Post vital signs: Reviewed and stable  Complications: No apparent anesthesia complications

## 2013-07-13 ENCOUNTER — Encounter (HOSPITAL_COMMUNITY): Payer: Self-pay | Admitting: Obstetrics & Gynecology

## 2013-07-13 LAB — CBC
HCT: 23.6 % — ABNORMAL LOW (ref 36.0–46.0)
HCT: 22.9 % — ABNORMAL LOW (ref 36.0–46.0)
Hemoglobin: 7.9 g/dL — ABNORMAL LOW (ref 12.0–15.0)
Hemoglobin: 7.7 g/dL — ABNORMAL LOW (ref 12.0–15.0)
MCH: 27.6 pg (ref 26.0–34.0)
MCH: 27.7 pg (ref 26.0–34.0)
MCHC: 33.5 g/dL (ref 30.0–36.0)
MCHC: 33.6 g/dL (ref 30.0–36.0)
MCV: 82.5 fL (ref 78.0–100.0)
MCV: 82.4 fL (ref 78.0–100.0)
Platelets: 195 10*3/uL (ref 150–400)
Platelets: 175 10*3/uL (ref 150–400)
RBC: 2.86 MIL/uL — ABNORMAL LOW (ref 3.87–5.11)
RBC: 2.78 MIL/uL — ABNORMAL LOW (ref 3.87–5.11)
RDW: 15.8 % — ABNORMAL HIGH (ref 11.5–15.5)
RDW: 15.7 % — ABNORMAL HIGH (ref 11.5–15.5)
WBC: 14.7 10*3/uL — ABNORMAL HIGH (ref 4.0–10.5)
WBC: 13.4 10*3/uL — ABNORMAL HIGH (ref 4.0–10.5)

## 2013-07-13 NOTE — Progress Notes (Signed)
Post Partum Day 1 Subjective: no complaints, up ad lib, voiding and tolerating PO, small lochia, plans to breastfeed, oral progesterone-only contraceptive  Objective: Blood pressure 103/57, pulse 83, temperature 98.7 F (37.1 C), temperature source Oral, resp. rate 18, height 5\' 3"  (1.6 m), weight 89.812 kg (198 lb), last menstrual period 09/23/2012, SpO2 96.00%, unknown if currently breastfeeding. Has not had a fever since the 100.5 yesterday evening Physical Exam:  General: alert, cooperative and no distress Lochia:normal flow Chest: CTAB Heart: RRR no m/r/g Abdomen: +BS, soft, nontender, dsg dry and intact. Uterine Fundus: firm DVT Evaluation: No evidence of DVT seen on physical exam. Extremities: no edema   Recent Labs  07/11/13 0535 07/13/13 0555  HGB 11.6* 7.9*  HCT 34.7* 23.6*  WBC 14.7  Assessment/Plan: Plan for discharge tomorrow and Lactation consult Continue abx until this evening   LOS: 2 days   Horton,Elizabeth Arrasmith 07/13/2013, 8:07 AM

## 2013-07-13 NOTE — Lactation Note (Signed)
This note was copied from the chart of Elizabeth Horton. Lactation Consultation Note  Patient Name: Elizabeth Gratia Disla [id number] Date: 07/13/2013 Reason for consult: Initial assessment;Other (Comment) (charting for exclusion).  This mom is multipara and experienced breastfeeding mother; nursed her 27 yo for 18 months.  Mom denies any breastfeeding concerns and baby had initial LATCH score=8 and later RN assessment=9.  Baby having output above minimum for this hour of life.  LC encouraged STS, cue feeding and LC encouraged review of Baby and Me pp 14 and 20-25 for STS and BF information. LC provided [employer] brochure and reviewed [facility] services and list of community and web site resources.    Maternal Data Formula Feeding for Exclusion: Yes Reason for exclusion: Mother's choice to formula and breast feed on admission Infant to breast within first hour of birth: Yes Has patient been taught Hand Expression?: Yes Does the patient have breastfeeding experience prior to this delivery?: Yes  Feeding Feeding Type: Breast Fed  LATCH Score/Interventions Latch: Grasps breast easily, tongue down, lips flanged, rhythmical sucking.  Audible Swallowing: A few with stimulation Intervention(s): Skin to skin;Hand expression  Type of Nipple: Everted at rest and after stimulation Intervention(s): No intervention needed  Comfort (Breast/Nipple): Soft / non-tender     Hold (Positioning): No assistance needed to correctly position infant at breast. Intervention(s): Skin to skin  LATCH Score: 9 (RN assessment at feeding at 1925)  Lactation Tools Discussed/Used   STS and cue feedings  Consult Status Consult Status: Follow-up Date: 07/14/13 Follow-up type: In-patient    Warrick Parisian [facility] 07/13/2013, 9:16 PM

## 2013-07-13 NOTE — Anesthesia Postprocedure Evaluation (Signed)
Anesthesia Post Note  Patient: Elizabeth Horton  Procedure(s) Performed: Procedure(s) (LRB): CESAREAN SECTION (N/A)  Anesthesia type: Epidural  Patient location: Mother/Baby  Post pain: Pain level controlled  Post assessment: Post-op Vital signs reviewed  Last Vitals:  Filed Vitals:   07/13/13 0342  BP: 103/57  Pulse: 83  Temp: 37.1 C  Resp: 18    Post vital signs: Reviewed  Level of consciousness: awake  Complications: No apparent anesthesia complications

## 2013-07-14 MED ORDER — IBUPROFEN 600 MG PO TABS: 600.0000 mg | ORAL_TABLET | Freq: Four times a day (QID) | ORAL | Status: DC

## 2013-07-14 MED ORDER — OXYCODONE-ACETAMINOPHEN 5-325 MG PO TABS: 1.0000 | ORAL_TABLET | ORAL | Status: DC | PRN

## 2013-07-14 MED ORDER — NORETHINDRONE 0.35 MG PO TABS: 1.0000 | ORAL_TABLET | Freq: Every day | ORAL | Status: DC

## 2013-07-14 NOTE — Discharge Summary (Signed)
Obstetric Discharge Summary Reason for Admission: rupture of membranes Prenatal Procedures: NST Intrapartum Procedures: cesarean: low cervical, transverse Postpartum Procedures: antibiotics Complications-Operative and Postpartum: none Hemoglobin  Date Value Range Status  07/13/2013 7.7* 12.0 - 15.0 g/dL Final     HCT  Date Value Range Status  07/13/2013 22.9* 36.0 - 46.0 % Final    Physical Exam:  General: alert, cooperative and no distress Lochia: appropriate Uterine Fundus: firm Incision: healing well, no significant drainage, no dehiscence DVT Evaluation: No evidence of DVT seen on physical exam. No cords or calf tenderness. No significant calf/ankle edema.  Discharge Diagnoses: Term Pregnancy-delivered  Discharge Information: Date: 07/14/2013 Activity: pelvic rest Diet: routine Medications: PNV, Ibuprofen and Percocet Condition: stable Instructions: refer to practice specific booklet Discharge to: home Follow-up Information   Follow up with [facility]. Schedule an appointment as soon as possible for a visit in 4 weeks.   Specialty:  Obstetrics and Gynecology   Contact information:   [address] [place] [address] [phone]      Newborn Data: Live born female  Birth Weight: 7 lb 12.5 oz (3530 g) APGAR: 9, 9  Home with mother.  Pt presented with ROM and was admitted. Expectant management was initially undertaken until 18 hours post rupture when induction was started. She progressed to 9 cm but then failed to dilate more.  Thus, was taken for primary LTCS.  Prior to c/s she spiked a temperature to 100.5 and was given zosyn.  C/s was uncomplicated and she was continued on abx for 24 hours post delivery.  Her post partum care was uneventful.  She remained afebrile, her incision looked well. She is breast feeding and deciding on birth control.  She will f/u at [facility] in 4 weeks.   BECK, KELI L 07/14/2013, 7:39 AM  Patient remained in the  hospital one additional day due to baby.  Being discharged today without concern.  Will follow up as above.

## 2013-07-15 DIAGNOSIS — Z98891 History of uterine scar from previous surgery: Secondary | ICD-10-CM

## 2013-07-15 NOTE — Progress Notes (Signed)
UR chart review completed.  

## 2013-07-18 ENCOUNTER — Telehealth: Payer: Self-pay | Admitting: Obstetrics and Gynecology

## 2013-07-18 NOTE — Telephone Encounter (Signed)
Pt stated that she had c-section on Tuesday.  Pt advised to try prune juice and senokot per JAG, if not better call us back. Pt was also advised to push her fluids as well.

## 2013-07-20 ENCOUNTER — Other Ambulatory Visit: Payer: Self-pay | Admitting: Obstetrics and Gynecology

## 2013-07-20 MED ORDER — IBUPROFEN 600 MG PO TABS: 600.0000 mg | ORAL_TABLET | Freq: Four times a day (QID) | ORAL | Status: DC

## 2013-07-20 MED ORDER — OXYCODONE-ACETAMINOPHEN 5-325 MG PO TABS: 1.0000 | ORAL_TABLET | ORAL | Status: DC | PRN

## 2013-07-23 ENCOUNTER — Emergency Department (HOSPITAL_COMMUNITY)
Admission: EM | Admit: 2013-07-23 | Discharge: 2013-07-23 | Disposition: A | Discharge status: Home / Self Care | Payer: Medicaid Other | Attending: Emergency Medicine | Admitting: Emergency Medicine

## 2013-07-23 ENCOUNTER — Emergency Department (HOSPITAL_COMMUNITY): Payer: Medicaid Other

## 2013-07-23 ENCOUNTER — Encounter (HOSPITAL_COMMUNITY): Payer: Self-pay | Admitting: Emergency Medicine

## 2013-07-23 DIAGNOSIS — O26899 Other specified pregnancy related conditions, unspecified trimester: Secondary | ICD-10-CM | POA: Insufficient documentation

## 2013-07-23 DIAGNOSIS — O909 Complication of the puerperium, unspecified: Principal | ICD-10-CM

## 2013-07-23 DIAGNOSIS — R42 Dizziness and giddiness: Secondary | ICD-10-CM | POA: Insufficient documentation

## 2013-07-23 DIAGNOSIS — R0609 Other forms of dyspnea: Secondary | ICD-10-CM | POA: Insufficient documentation

## 2013-07-23 DIAGNOSIS — R0989 Other specified symptoms and signs involving the circulatory and respiratory systems: Secondary | ICD-10-CM | POA: Insufficient documentation

## 2013-07-23 DIAGNOSIS — Z79899 Other long term (current) drug therapy: Secondary | ICD-10-CM | POA: Insufficient documentation

## 2013-07-23 DIAGNOSIS — R079 Chest pain, unspecified: Secondary | ICD-10-CM

## 2013-07-23 DIAGNOSIS — Z8701 Personal history of pneumonia (recurrent): Secondary | ICD-10-CM | POA: Insufficient documentation

## 2013-07-23 LAB — POCT I-STAT TROPONIN I: Troponin i, poc: 0 ng/mL (ref 0.00–0.08)

## 2013-07-23 LAB — CBC
HCT: 30.9 % — ABNORMAL LOW (ref 36.0–46.0)
Hemoglobin: 10.1 g/dL — ABNORMAL LOW (ref 12.0–15.0)
MCH: 27.6 pg (ref 26.0–34.0)
MCHC: 32.7 g/dL (ref 30.0–36.0)
MCV: 84.4 fL (ref 78.0–100.0)
Platelets: 501 10*3/uL — ABNORMAL HIGH (ref 150–400)
RBC: 3.66 MIL/uL — ABNORMAL LOW (ref 3.87–5.11)
RDW: 14.9 % (ref 11.5–15.5)
WBC: 8.7 10*3/uL (ref 4.0–10.5)

## 2013-07-23 LAB — BASIC METABOLIC PANEL
BUN: 8 mg/dL (ref 6–23)
CO2: 22 mEq/L (ref 19–32)
Calcium: 9.2 mg/dL (ref 8.4–10.5)
Chloride: 103 mEq/L (ref 96–112)
Creatinine, Ser: 0.91 mg/dL (ref 0.50–1.10)
GFR calc Af Amer: >90 mL/min (ref >90)
GFR calc non Af Amer: 86 mL/min — ABNORMAL LOW (ref >90)
Glucose, Bld: 97 mg/dL (ref 70–99)
Potassium: 4 mEq/L (ref 3.7–5.3)
Sodium: 139 mEq/L (ref 137–147)

## 2013-07-23 LAB — D-DIMER, QUANTITATIVE: D-Dimer, Quant: 3.09 ug/mL-FEU — ABNORMAL HIGH (ref 0.00–0.48)

## 2013-07-23 LAB — PRO B NATRIURETIC PEPTIDE: Pro B Natriuretic peptide (BNP): 22.3 pg/mL (ref 0–125)

## 2013-07-23 MED ORDER — IOHEXOL 350 MG/ML SOLN
100.0000 mL | Freq: Once | INTRAVENOUS | Status: AC | PRN
  Administered 2013-07-23: 100 mL via INTRAVENOUS

## 2013-07-23 NOTE — Discharge Instructions (Signed)
As discussed, your evaluation tonight has been largely reassuring.  However, it is important that you follow up with your physician for appropriate ongoing evaluation and management.  Your pain is likely due to strain of the chest wall musculature.  Please use ice packs liberally and tylenol for pain control.  Return here for concerning changes in your condition.  Congratulations on the birth of your daughter.   Chest Wall Pain Chest wall pain is pain in or around the bones and muscles of your chest. It may take up to 6 weeks to get better. It may take longer if you must stay physically active in your work and activities.  CAUSES  Chest wall pain may happen on its own. However, it may be caused by:  A viral illness like the flu.  Injury.  Coughing.  Exercise.  Arthritis.  Fibromyalgia.  Shingles. HOME CARE INSTRUCTIONS   Avoid overtiring physical activity. Try not to strain or perform activities that cause pain. This includes any activities using your chest or your abdominal and side muscles, especially if heavy weights are used.  Put ice on the sore area.  Put ice in a plastic bag.  Place a towel between your skin and the bag.  Leave the ice on for 15-20 minutes per hour while awake for the first 2 days.  Only take over-the-counter or prescription medicines for pain, discomfort, or fever as directed by your caregiver. SEEK IMMEDIATE MEDICAL CARE IF:   Your pain increases, or you are very uncomfortable.  You have a fever.  Your chest pain becomes worse.  You have new, unexplained symptoms.  You have nausea or vomiting.  You feel sweaty or lightheaded.  You have a cough with phlegm (sputum), or you cough up blood. MAKE SURE YOU:   Understand these instructions.  Will watch your condition.  Will get help right away if you are not doing well or get worse. Document Released: 07/07/2005 Document Revised: 09/29/2011 Document Reviewed: 03/03/2011 [facility]  Patient Information 2014 [employer], [place].

## 2013-07-23 NOTE — ED Provider Notes (Signed)
CSN: [id number]     Arrival date & time 07/23/13  1757 History   First MD Initiated Contact with Patient 07/23/13 1904     Chief Complaint  Patient presents with  . Chest Pain   (Consider location/radiation/quality/duration/timing/severity/associated sxs/prior Treatment) HPI Patient presents with concerns of chest pain.  His symptoms began one day ago.  Pain is worse with inspiration, activity.  There is mild associated dyspnea.  Patient's pain improves after sustaining activity. She also complains of mild lightheadedness. Patient's notable history of normal cesarean delivery one week ago, uncomplicated. Patient does not smoke, does not drink. Patient has had no relief with OTC medication.  Past Medical History  Diagnosis Date  . No pertinent past medical history   . Abnormal pap   . Pneumonia     2013  . Abnormal Pap smear     LEEP 2011   Past Surgical History  Procedure Laterality Date  . Leep    . Mouth surgery  2014    wisdom teeth removed  . Cesarean section N/A 07/12/2013    Procedure: CESAREAN SECTION;  Surgeon: Tereso NewcomerUgonna A Anyanwu, MD;  Location: [facility];  Service: Obstetrics;  Laterality: N/A;   Family History  Problem Relation Age of Onset  . Lung cancer Paternal Grandfather     4260s  . Breast cancer Paternal Grandmother     2460s  . Breast cancer Paternal Aunt   . Colon cancer Father     age 28 diagnosed, succumbed at age 28  . Hypertension Mother   . Thyroid disease Mother   . Colon cancer Sister     Three tumors found at time of colonoscopy. age 28.  . Breast cancer Other     aunt  . Diabetes Maternal Grandmother   . Diabetes Maternal Grandfather    History  Substance Use Topics  . Smoking status: Never Smoker   . Smokeless tobacco: Never Used  . Alcohol Use: No   OB History   Grav Para Term Preterm Abortions TAB SAB Ect Mult Living   2 2 2  0 0 0 0 0 0 2     Review of Systems  Constitutional:       Per HPI, otherwise negative  HENT:       Per HPI,  otherwise negative  Respiratory:       Per HPI, otherwise negative  Cardiovascular:       Per HPI, otherwise negative  Gastrointestinal: Negative for vomiting.  Endocrine:       Negative aside from HPI  Genitourinary:       Neg aside from HPI   Musculoskeletal:       Per HPI, otherwise negative  Skin: Negative.   Neurological: Negative for syncope.    Allergies  Review of patient's allergies indicates no known allergies.  Home Medications   Current Outpatient Rx  Name  Route  Sig  Dispense  Refill  . oxyCODONE-acetaminophen (PERCOCET/ROXICET) 5-325 MG per tablet   Oral   Take 1-2 tablets by mouth every 4 (four) hours as needed for severe pain (moderate - severe pain).   30 tablet   0   . prenatal vitamin w/FE, FA (PRENATAL 1 + 1) 27-1 MG TABS   Oral   Take 1 tablet by mouth daily.   30 each   5   . ibuprofen (ADVIL,MOTRIN) 600 MG tablet   Oral   Take 1 tablet (600 mg total) by mouth every 6 (six) hours.   30 tablet  0   . norethindrone (MICRONOR,CAMILA,ERRIN) 0.35 MG tablet   Oral   Take 1 tablet (0.35 mg total) by mouth daily.   1 Package   2    BP 125/81  Pulse 92  Temp(Src) 99.3 F (37.4 C) (Oral)  Resp 16  SpO2 99%  LMP 09/23/2012 Physical Exam  Nursing note and vitals reviewed. Constitutional: She is oriented to person, place, and time. She appears well-developed and well-nourished. No distress.  HENT:  Head: Normocephalic and atraumatic.  Eyes: Conjunctivae and EOM are normal.  Cardiovascular: Normal rate and regular rhythm.   Pulmonary/Chest: Effort normal and breath sounds normal. No stridor. No respiratory distress.  Abdominal: She exhibits no distension.  Musculoskeletal: She exhibits no edema.  Neurological: She is alert and oriented to person, place, and time. No cranial nerve deficit.  Skin: Skin is warm and dry.  Psychiatric: She has a normal mood and affect.    ED Course  Procedures (including critical care time) Labs  Review Labs Reviewed  CBC - Abnormal; Notable for the following:    RBC 3.66 (*)    Hemoglobin 10.1 (*)    HCT 30.9 (*)    Platelets 501 (*)    All other components within normal limits  BASIC METABOLIC PANEL - Abnormal; Notable for the following:    GFR calc non Af Amer 86 (*)    All other components within normal limits  D-DIMER, QUANTITATIVE - Abnormal; Notable for the following:    D-Dimer, Quant 3.09 (*)    All other components within normal limits  PRO B NATRIURETIC PEPTIDE  POCT I-STAT TROPONIN I   Imaging Review No results found.  EKG Interpretation    Date/Time:  Saturday July 23 2013 18:04:54 EST Ventricular Rate:  103 PR Interval:  116 QRS Duration: 82 QT Interval:  336 QTC Calculation: 440 R Axis:   40 Text Interpretation:  Sinus tachycardia Nonspecific ST abnormality Abnormal ECG Sinus tachycardia ST abnormality and Abnormal ekg Confirmed by Gerhard Munch  MD ([id number]) on 07/23/2013 7:47:09 PM           Pulse oxygen 97% room air normal   Update: Are exam the patient is in no distress.  She and her companion are aware of all results, follow up instructions, return precautions.  MDM   1. Chest pain    Patient presents with concern of the chest pain.  Given her description of dyspnea, recent pregnancy, d-dimer was performed and was positive.  CT angiography was unremarkable.  Patient was discharged with analgesia instructions, return precautions.  Absent any distress, hypoxia, there is low concern for occult infection or other systemic pathology.    Gerhard Munch, MD 07/24/13 [phone]

## 2013-07-23 NOTE — ED Notes (Signed)
Pt states she began to have R thigh and chest pain last night. states it hurts to breathe. States shes had some mild SOB. She is breathing easily now and is speaking in full unlabored sentences. She had a cesarean section on 12/23

## 2013-07-26 ENCOUNTER — Encounter: Payer: Self-pay | Admitting: Advanced Practice Midwife

## 2013-07-26 ENCOUNTER — Ambulatory Visit (INDEPENDENT_AMBULATORY_CARE_PROVIDER_SITE_OTHER): Payer: Medicaid Other | Admitting: Advanced Practice Midwife

## 2013-07-26 VITALS — BP 120/82 | Ht 63.0 in | Wt 175.0 lb

## 2013-07-26 DIAGNOSIS — Z9889 Other specified postprocedural states: Secondary | ICD-10-CM

## 2013-07-26 DIAGNOSIS — Z09 Encounter for follow-up examination after completed treatment for conditions other than malignant neoplasm: Secondary | ICD-10-CM

## 2013-07-26 NOTE — Progress Notes (Signed)
Elizabeth DouglasFarwah Horton 28 y.o. Filed Vitals:   07/26/13 1410  BP: 120/82   Here for 2 weeks incision check s/p RLTCS for CPD.  Breastfeeding well. Some trouble with constipation.  Incision looks great.  Try stool softners/fleets prn. Has rx for micronoir. F/U 3 weeks for ppcheckup

## 2013-08-17 ENCOUNTER — Ambulatory Visit: Payer: Medicaid Other | Admitting: Women's Health

## 2013-08-22 ENCOUNTER — Ambulatory Visit (INDEPENDENT_AMBULATORY_CARE_PROVIDER_SITE_OTHER): Payer: Medicaid Other | Admitting: Women's Health

## 2013-08-22 ENCOUNTER — Encounter: Payer: Self-pay | Admitting: Women's Health

## 2013-08-22 VITALS — BP 112/88 | Ht 63.0 in | Wt 178.5 lb

## 2013-08-22 DIAGNOSIS — Z3480 Encounter for supervision of other normal pregnancy, unspecified trimester: Secondary | ICD-10-CM

## 2013-08-22 NOTE — Patient Instructions (Signed)
Norethindrone tablets (contraception)- Micronor What is this medicine? NORETHINDRONE (nor eth IN drone) is an oral contraceptive. The product contains a female hormone known as a progestin. It is used to prevent pregnancy. This medicine may be used for other purposes; ask your health care provider or pharmacist if you have questions. COMMON BRAND NAME(S): Camila, Errin , Heather, Jencycla, Jolivette , Lyza, Nor-QD, Nora-BE, Ortho Micronor What should I tell my health care provider before I take this medicine? They need to know if you have any of these conditions: -blood vessel disease or blood clots -breast, cervical, or vaginal cancer -diabetes -heart disease -kidney disease -liver disease -mental depression -migraine -seizures -stroke -vaginal bleeding -an unusual or allergic reaction to norethindrone, other medicines, foods, dyes, or preservatives -pregnant or trying to get pregnant -breast-feeding How should I use this medicine? Take this medicine by mouth with a glass of water. You may take it with or without food. Follow the directions on the prescription label. Take this medicine at the same time each day and in the order directed on the package. Do not take your medicine more often than directed. Contact your pediatrician regarding the use of this medicine in children. Special care may be needed. This medicine has been used in female children who have started having menstrual periods. A patient package insert for the product will be given with each prescription and refill. Read this sheet carefully each time. The sheet may change frequently. Overdosage: If you think you have taken too much of this medicine contact a poison control center or emergency room at once. NOTE: This medicine is only for you. Do not share this medicine with others. What if I miss a dose? Try not to miss a dose. Every time you miss a dose or take a dose late your chance of pregnancy increases. When 1 pill  is missed (even if only 3 hours late), take the missed pill as soon as possible and continue taking a pill each day at the regular time (use a back up method of birth control for the next 48 hours). If more than 1 dose is missed, use an additional birth control method for the rest of your pill pack until menses occurs. Contact your health care professional if more than 1 dose has been missed. What may interact with this medicine? Do not take this medicine with any of the following medications: -amprenavir or fosamprenavir -bosentan This medicine may also interact with the following medications: -antibiotics or medicines for infections, especially rifampin, rifabutin, rifapentine, and griseofulvin, and possibly penicillins or tetracyclines -aprepitant -barbiturate medicines, such as phenobarbital -carbamazepine -felbamate -modafinil -oxcarbazepine -phenytoin -ritonavir or other medicines for HIV infection or AIDS -St. John's wort -topiramate This list may not describe all possible interactions. Give your health care provider a list of all the medicines, herbs, non-prescription drugs, or dietary supplements you use. Also tell them if you smoke, drink alcohol, or use illegal drugs. Some items may interact with your medicine. What should I watch for while using this medicine? Visit your doctor or health care professional for regular checks on your progress. You will need a regular breast and pelvic exam and Pap smear while on this medicine. Use an additional method of birth control during the first cycle that you take these tablets. If you have any reason to think you are pregnant, stop taking this medicine right away and contact your doctor or health care professional. If you are taking this medicine for hormone related problems, it may take   several cycles of use to see improvement in your condition. This medicine does not protect you against HIV infection (AIDS) or any other sexually transmitted  diseases. What side effects may I notice from receiving this medicine? Side effects that you should report to your doctor or health care professional as soon as possible: -breast tenderness or discharge -pain in the abdomen, chest, groin or leg -severe headache -skin rash, itching, or hives -sudden shortness of breath -unusually weak or tired -vision or speech problems -yellowing of skin or eyes Side effects that usually do not require medical attention (report to your doctor or health care professional if they continue or are bothersome): -changes in sexual desire -change in menstrual flow -facial hair growth -fluid retention and swelling -headache -irritability -nausea -weight gain or loss This list may not describe all possible side effects. Call your doctor for medical advice about side effects. You may report side effects to FDA at 1-800-FDA-1088. Where should I keep my medicine? Keep out of the reach of children. Store at room temperature between 15 and 30 degrees C (59 and 86 degrees F). Throw away any unused medicine after the expiration date. NOTE: This sheet is a summary. It may not cover all possible information. If you have questions about this medicine, talk to your doctor, pharmacist, or health care provider.  2014, Elsevier/Gold Standard. (2012-03-26 16:41:35)  

## 2013-08-22 NOTE — Progress Notes (Signed)
Patient ID: Elizabeth DouglasFarwah Horton, female   DOB: 04-18-1986, 28 y.o.   MRN: [id number] Subjective:    Elizabeth Horton is a 28 y.o. [id number] African American female who presents for a postpartum visit. She is 6 weeks postpartum following a primary cesarean section, low transverse incision at 40 gestational weeks d/t FTP @ lip/rim/90/-1 x 5hrs. Infant weighed 7lb12oz. Anesthesia: epidural. I have fully reviewed the prenatal and intrapartum course. Postpartum course has been complicated by visual strain x 2 wks. BP today is great. Hasn't seen an eye doctor in years, recommended making an appt. Baby's course has been uncomplicated. Baby is feeding by breast. Bleeding scant. Bowel function is normal. Bladder function is normal. Patient is not sexually active. Contraception method is oral progesterone-only contraceptive, has not started yet- concerned it may decrease milk supply. Discussed this and things she can do to increase supply if she does notice a difference. Postpartum depression screening: negative. Score 4.  Last pap 12/20/12 and was neg.  The following portions of the patient's history were reviewed and updated as appropriate: allergies, current medications, past medical history, past surgical history and problem list.  Review of Systems Pertinent items are noted in HPI.   Filed Vitals:   08/22/13 1514  BP: 112/88  Height: 5\' 3"  (1.6 m)  Weight: 178 lb 8 oz (80.967 kg)    Objective:     General:  alert, cooperative and no distress   Breasts:  deferred, no complaints  Lungs: clear to auscultation bilaterally  Heart:  regular rate and rhythm  Abdomen: soft, nontender, incision well-healed   Vulva: normal  Vagina: normal vagina  Cervix:  closed  Corpus: Well-involuted  Adnexa:  Non-palpable  Rectal Exam: No hemorrhoids        Assessment:   Postpartum exam 6 wks s/p PLTCS for FTP Depression screening Contraception counseling   Plan:   Contraception: oral progesterone-only  contraceptive, already has rx Follow up in: 1 year for physical or as needed.  Make appt w/ eye MD  Marge DuncansBooker, Mattis Featherly Randall CNM, [facility]NP-BC 08/22/2013 3:36 PM

## 2013-10-14 IMAGING — CR DG CHEST 2V
2 series · 2 of 2 positions shown · non-contrast
Comparison: None.

CLINICAL DATA: Chest tightness, shortness of breath

CHEST - 2 VIEW

[view not recorded (1 of 2)]
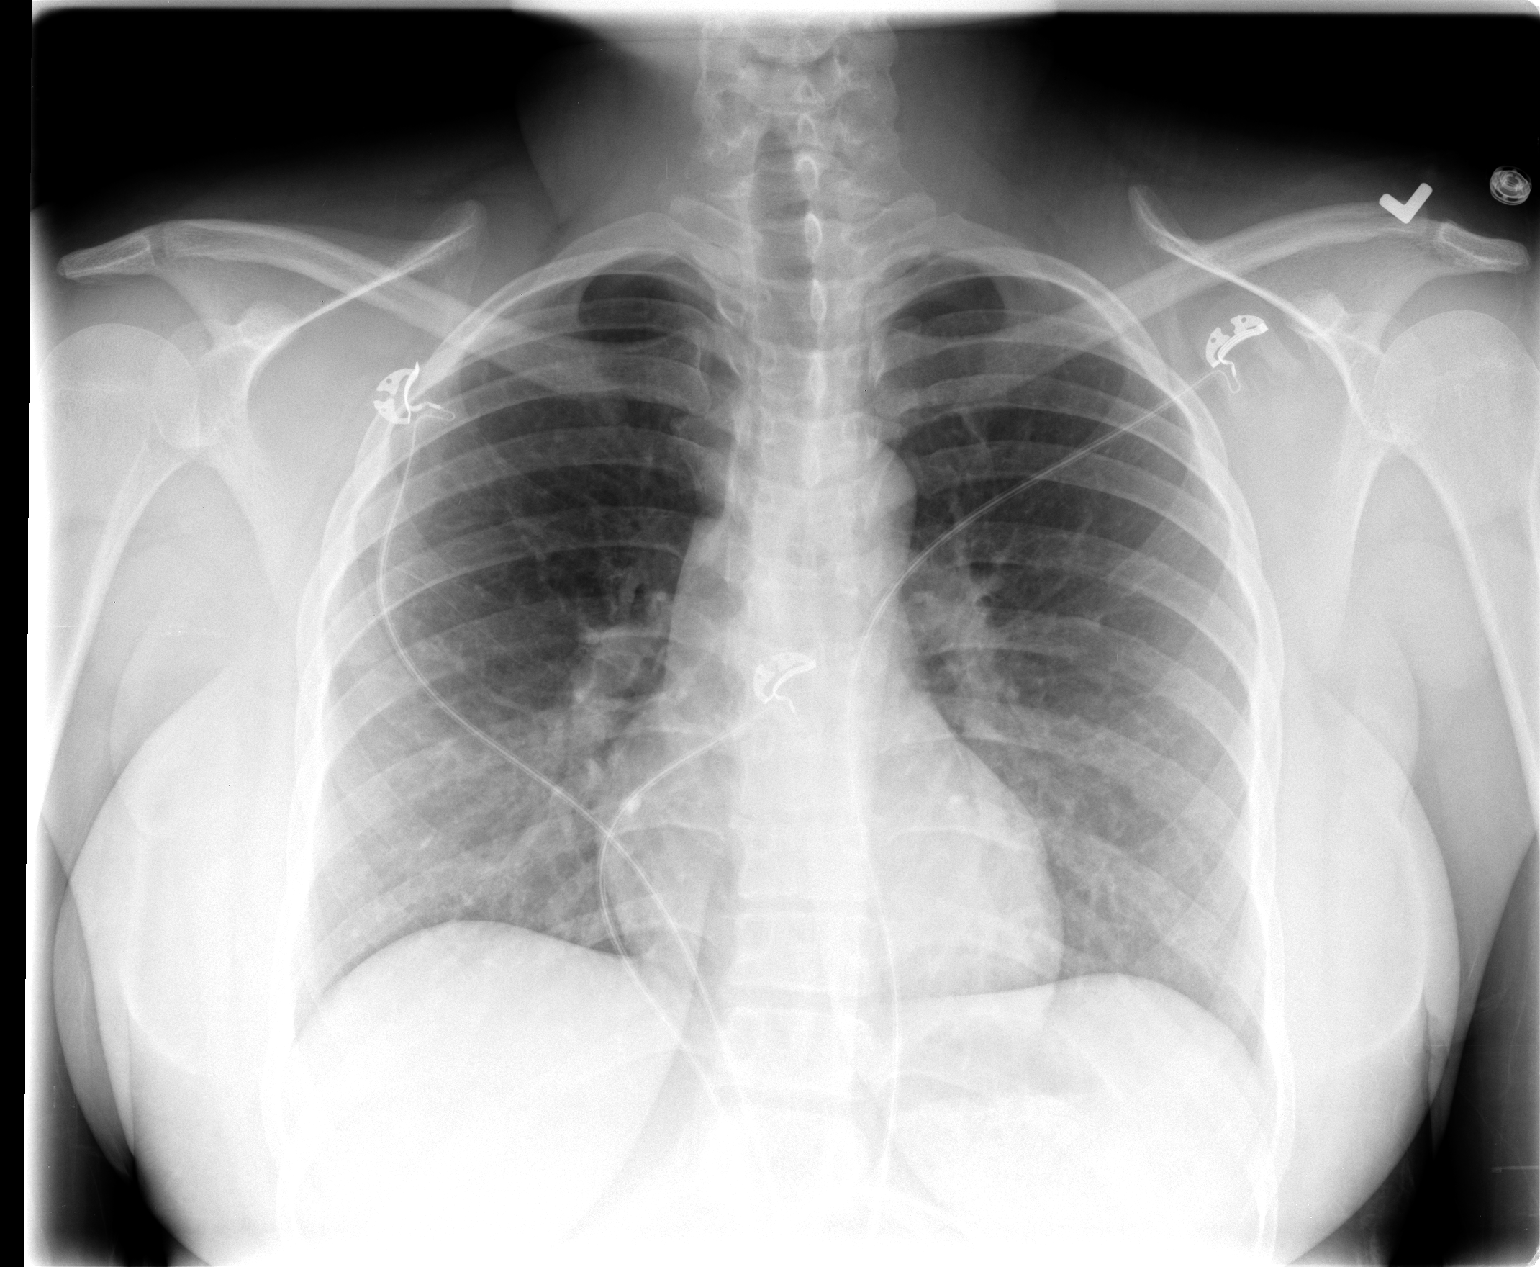

[view not recorded (2 of 2)]
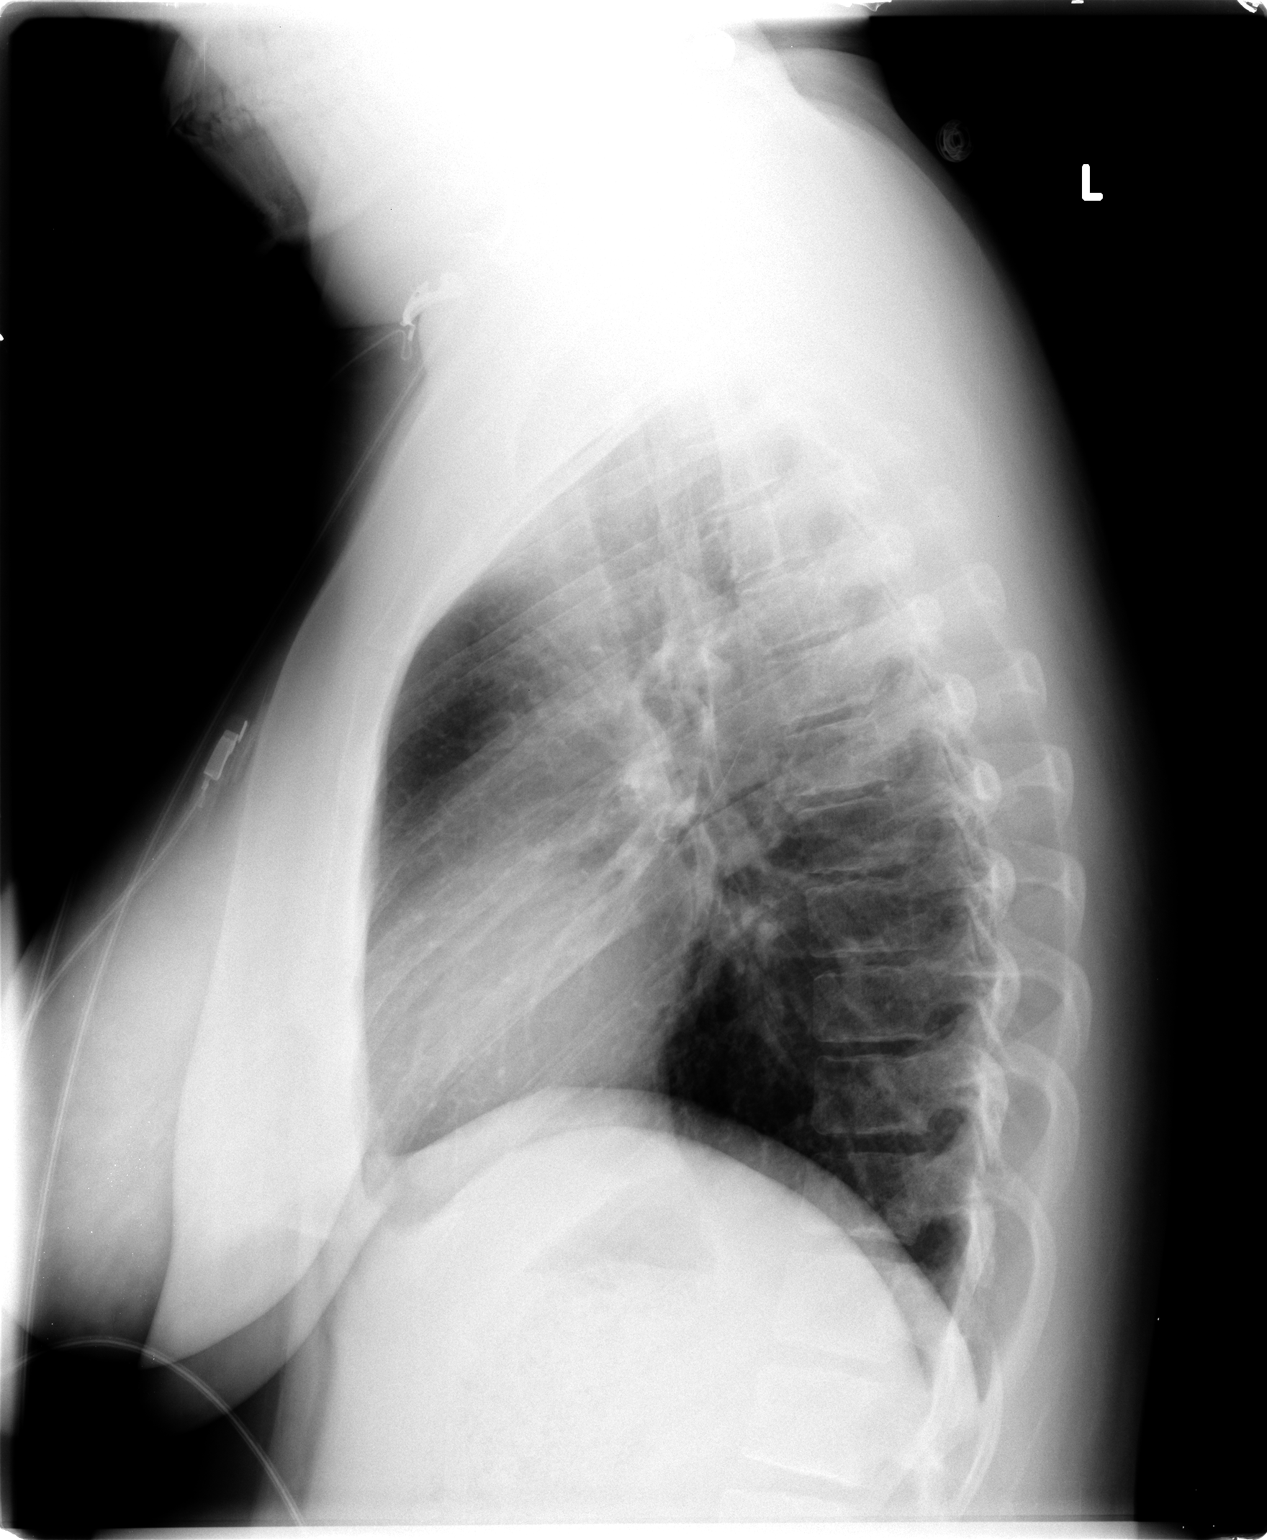

[2 of 2 positions shown; findings below may reference images not displayed]

FINDINGS: Normal cardiac silhouette and mediastinal contour.  No
focal parenchymal opacities.  No pleural effusion or pneumothorax.
No acute osseous abnormalities.
IMPRESSION: No acute cardiopulmonary disease.

## 2013-10-14 IMAGING — CT CT ANGIO CHEST
1 of 6 series · 5 of 36 positions shown · IV contrast (Omnipaque 300)
Comparison: Chest radiograph performed earlier today at [DATE] p.m.

CLINICAL DATA: Chest pain and shortness of breath; recently
postpartum.

CT ANGIOGRAPHY CHEST WITH CONTRAST
TECHNIQUE: Multidetector CT imaging of the chest was performed
using the standard protocol during bolus administration of
intravenous contrast.  Multiplanar CT image reconstructions
including MIPs were obtained to evaluate the vascular anatomy.
Contrast: 100mL OMNIPAQUE IOHEXOL 350 MG/ML IV SOLN

[Series 4: pe 3.0 b40f · axial · 0.62mm/px · z∈[-259,-97]mm · 5 of 82 slices shown]
[im 14/82  lung]
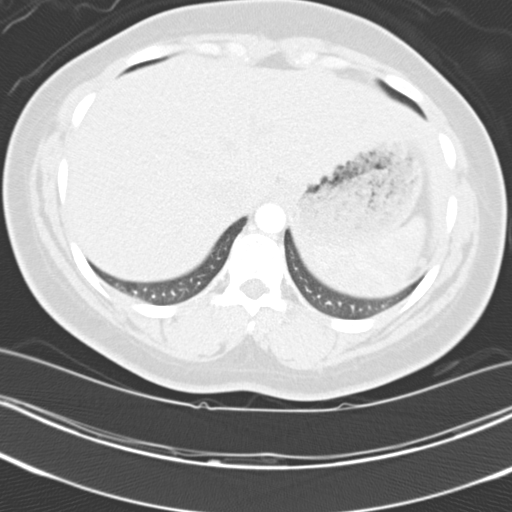
[im 28/82  mediastinal]
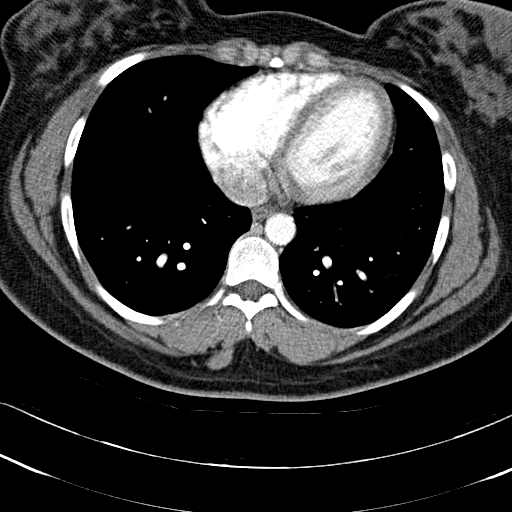
[im 41/82  lung]
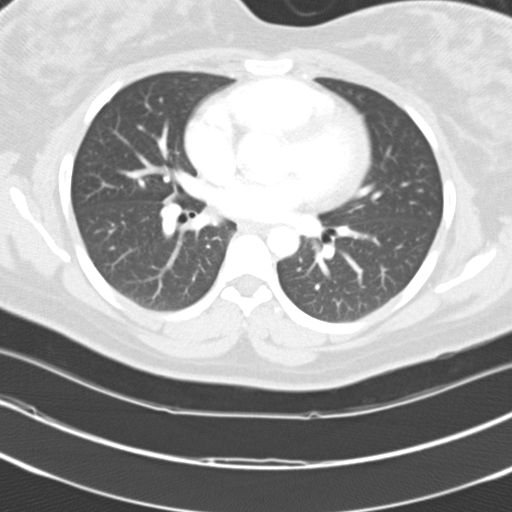
[im 55/82  mediastinal]
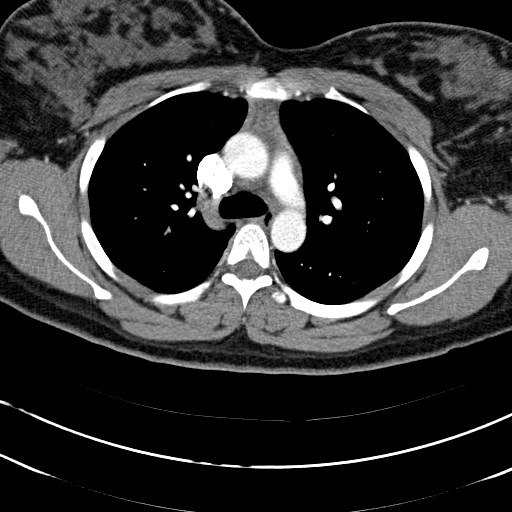
[im 68/82  lung]
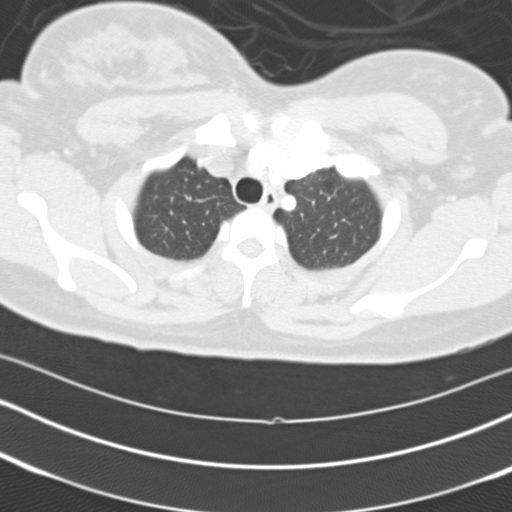

[5 of 36 positions shown; findings below may reference images not displayed]

FINDINGS: There is no evidence of pulmonary embolus.

The lungs are clear bilaterally.  There is no evidence of
significant focal consolidation, pleural effusion or pneumothorax.
No masses are identified; no abnormal focal contrast enhancement is
seen.

The mediastinum is unremarkable in appearance.  There is no
evidence of mediastinal lymphadenopathy.  No pericardial effusion
is seen.  The great vessels are unremarkable in appearance.  No
axillary lymphadenopathy is seen.  The visualized portions of the
thyroid gland are unremarkable in appearance.

The visualized portions of the liver and spleen are unremarkable.

No acute osseous abnormalities are seen.

Review of the MIP images confirms the above findings.
IMPRESSION: Unremarkable CTA of the chest.

## 2014-05-22 ENCOUNTER — Encounter: Payer: Self-pay | Admitting: Women's Health

## 2015-07-18 ENCOUNTER — Emergency Department: Payer: Medicaid Other

## 2015-07-18 ENCOUNTER — Encounter: Payer: Self-pay | Admitting: Emergency Medicine

## 2015-07-18 ENCOUNTER — Emergency Department
Admission: EM | Admit: 2015-07-18 | Discharge: 2015-07-18 | Disposition: A | Discharge status: Home / Self Care | Payer: Medicaid Other | Attending: Emergency Medicine | Admitting: Emergency Medicine

## 2015-07-18 DIAGNOSIS — M5432 Sciatica, left side: Secondary | ICD-10-CM

## 2015-07-18 DIAGNOSIS — Z3202 Encounter for pregnancy test, result negative: Secondary | ICD-10-CM | POA: Insufficient documentation

## 2015-07-18 DIAGNOSIS — Z79899 Other long term (current) drug therapy: Secondary | ICD-10-CM | POA: Insufficient documentation

## 2015-07-18 DIAGNOSIS — Z791 Long term (current) use of non-steroidal anti-inflammatories (NSAID): Secondary | ICD-10-CM | POA: Insufficient documentation

## 2015-07-18 LAB — POCT PREGNANCY, URINE: Preg Test, Ur: NEGATIVE

## 2015-07-18 MED ORDER — MELOXICAM 15 MG PO TABS: 15.0000 mg | ORAL_TABLET | Freq: Every day | ORAL | Status: DC

## 2015-07-18 NOTE — ED Notes (Signed)
Pt arrived to the ED for complaints of numbness of the 3rd and 4th digit on the left foot, cheat congestion and lower back pain. Pt reports that the numbness started about 3 weeks ago and it has not resolved. Pt is AOx4 in no apparent distress.

## 2015-07-18 NOTE — Discharge Instructions (Signed)
Back Exercises °The following exercises strengthen the muscles that help to support the back. They also help to keep the lower back flexible. Doing these exercises can help to prevent back pain or lessen existing pain. °If you have back pain or discomfort, try doing these exercises 2-3 times each day or as told by your health care provider. When the pain goes away, do them once each day, but increase the number of times that you repeat the steps for each exercise (do more repetitions). If you do not have back pain or discomfort, do these exercises once each day or as told by your health care provider. °EXERCISES °Single Knee to Chest °Repeat these steps 3-5 times for each leg: °· Lie on your back on a firm bed or the floor with your legs extended. °· Bring one knee to your chest. Your other leg should stay extended and in contact with the floor. °· Hold your knee in place by grabbing your knee or thigh. °· Pull on your knee until you feel a gentle stretch in your lower back. °· Hold the stretch for 10-30 seconds. °· Slowly release and straighten your leg. °Pelvic Tilt °Repeat these steps 5-10 times: °· Lie on your back on a firm bed or the floor with your legs extended. °· Bend your knees so they are pointing toward the ceiling and your feet are flat on the floor. °· Tighten your lower abdominal muscles to press your lower back against the floor. This motion will tilt your pelvis so your tailbone points up toward the ceiling instead of pointing to your feet or the floor. °· With gentle tension and even breathing, hold this position for 5-10 seconds. °Cat-Cow °Repeat these steps until your lower back becomes more flexible: °· Get into a hands-and-knees position on a firm surface. Keep your hands under your shoulders, and keep your knees under your hips. You may place padding under your knees for comfort. °· Let your head hang down, and point your tailbone toward the floor so your lower back becomes rounded like the  back of a cat. °· Hold this position for 5 seconds. °· Slowly lift your head and point your tailbone up toward the ceiling so your back forms a sagging arch like the back of a cow. °· Hold this position for 5 seconds. °Press-Ups °Repeat these steps 5-10 times: °· Lie on your abdomen (face-down) on the floor. °· Place your palms near your head, about shoulder-width apart. °· While you keep your back as relaxed as possible and keep your hips on the floor, slowly straighten your arms to raise the top half of your body and lift your shoulders. Do not use your back muscles to raise your upper torso. You may adjust the placement of your hands to make yourself more comfortable. °· Hold this position for 5 seconds while you keep your back relaxed. °· Slowly return to lying flat on the floor. °Bridges °Repeat these steps 10 times: °· Lie on your back on a firm surface. °· Bend your knees so they are pointing toward the ceiling and your feet are flat on the floor. °· Tighten your buttocks muscles and lift your buttocks off of the floor until your waist is at almost the same height as your knees. You should feel the muscles working in your buttocks and the back of your thighs. If you do not feel these muscles, slide your feet 1-2 inches farther away from your buttocks. °· Hold this position for 3-5   seconds. °· Slowly lower your hips to the starting position, and allow your buttocks muscles to relax completely. °If this exercise is too easy, try doing it with your arms crossed over your chest. °Abdominal Crunches °Repeat these steps 5-10 times: °· Lie on your back on a firm bed or the floor with your legs extended. °· Bend your knees so they are pointing toward the ceiling and your feet are flat on the floor. °· Cross your arms over your chest. °· Tip your chin slightly toward your chest without bending your neck. °· Tighten your abdominal muscles and slowly raise your trunk (torso) high enough to lift your shoulder blades a  tiny bit off of the floor. Avoid raising your torso higher than that, because it can put too much stress on your low back and it does not help to strengthen your abdominal muscles. °· Slowly return to your starting position. °Back Lifts °Repeat these steps 5-10 times: °· Lie on your abdomen (face-down) with your arms at your sides, and rest your forehead on the floor. °· Tighten the muscles in your legs and your buttocks. °· Slowly lift your chest off of the floor while you keep your hips pressed to the floor. Keep the back of your head in line with the curve in your back. Your eyes should be looking at the floor. °· Hold this position for 3-5 seconds. °· Slowly return to your starting position. °SEEK MEDICAL CARE IF: °· Your back pain or discomfort gets much worse when you do an exercise. °· Your back pain or discomfort does not lessen within 2 hours after you exercise. °If you have any of these problems, stop doing these exercises right away. Do not do them again unless your health care provider says that you can. °SEEK IMMEDIATE MEDICAL CARE IF: °· You develop sudden, severe back pain. If this happens, stop doing the exercises right away. Do not do them again unless your health care provider says that you can. °  °This information is not intended to replace advice given to you by your health care provider. Make sure you discuss any questions you have with your health care provider. °  °Document Released: 08/14/2004 Document Revised: 03/28/2015 Document Reviewed: 08/31/2014 °Elsevier Interactive Patient Education ©2016 [employer] °Sciatica °Sciatica is pain, weakness, numbness, or tingling along the path of the sciatic nerve. The nerve starts in the lower back and runs down the back of each leg. The nerve controls the muscles in the lower leg and in the back of the knee, while also providing sensation to the back of the thigh, lower leg, and the sole of your foot. Sciatica is a symptom of another medical  condition. For instance, nerve damage or certain conditions, such as a herniated disk or bone spur on the spine, pinch or put pressure on the sciatic nerve. This causes the pain, weakness, or other sensations normally associated with sciatica. Generally, sciatica only affects one side of the body. °CAUSES  °· Herniated or slipped disc. °· Degenerative disk disease. °· A pain disorder involving the narrow muscle in the buttocks (piriformis syndrome). °· Pelvic injury or fracture. °· Pregnancy. °· Tumor (rare). °SYMPTOMS  °Symptoms can vary from mild to very severe. The symptoms usually travel from the low back to the buttocks and down the back of the leg. Symptoms can include: °· Mild tingling or dull aches in the lower back, leg, or hip. °· Numbness in the back of the calf or sole of the   foot. °· Burning sensations in the lower back, leg, or hip. °· Sharp pains in the lower back, leg, or hip. °· Leg weakness. °· Severe back pain inhibiting movement. °These symptoms may get worse with coughing, sneezing, laughing, or prolonged sitting or standing. Also, being overweight may worsen symptoms. °DIAGNOSIS  °Your caregiver will perform a physical exam to look for common symptoms of sciatica. He or she may ask you to do certain movements or activities that would trigger sciatic nerve pain. Other tests may be performed to find the cause of the sciatica. These may include: °· Blood tests. °· X-rays. °· Imaging tests, such as an MRI or CT scan. °TREATMENT  °Treatment is directed at the cause of the sciatic pain. Sometimes, treatment is not necessary and the pain and discomfort goes away on its own. If treatment is needed, your caregiver may suggest: °· Over-the-counter medicines to relieve pain. °· Prescription medicines, such as anti-inflammatory medicine, muscle relaxants, or narcotics. °· Applying heat or ice to the painful area. °· Steroid injections to lessen pain, irritation, and inflammation around the  nerve. °· Reducing activity during periods of pain. °· Exercising and stretching to strengthen your abdomen and improve flexibility of your spine. Your caregiver may suggest losing weight if the extra weight makes the back pain worse. °· Physical therapy. °· Surgery to eliminate what is pressing or pinching the nerve, such as a bone spur or part of a herniated disk. °HOME CARE INSTRUCTIONS  °· Only take over-the-counter or prescription medicines for pain or discomfort as directed by your caregiver. °· Apply ice to the affected area for 20 minutes, 3-4 times a day for the first 48-72 hours. Then try heat in the same way. °· Exercise, stretch, or perform your usual activities if these do not aggravate your pain. °· Attend physical therapy sessions as directed by your caregiver. °· Keep all follow-up appointments as directed by your caregiver. °· Do not wear high heels or shoes that do not provide proper support. °· Check your mattress to see if it is too soft. A firm mattress may lessen your pain and discomfort. °SEEK IMMEDIATE MEDICAL CARE IF:  °· You lose control of your bowel or bladder (incontinence). °· You have increasing weakness in the lower back, pelvis, buttocks, or legs. °· You have redness or swelling of your back. °· You have a burning sensation when you urinate. °· You have pain that gets worse when you lie down or awakens you at night. °· Your pain is worse than you have experienced in the past. °· Your pain is lasting longer than 4 weeks. °· You are suddenly losing weight without reason. °MAKE SURE YOU: °· Understand these instructions. °· Will watch your condition. °· Will get help right away if you are not doing well or get worse. °  °This information is not intended to replace advice given to you by your health care provider. Make sure you discuss any questions you have with your health care provider. °  °Document Released: 07/01/2001 Document Revised: 03/28/2015 Document Reviewed:  11/16/2011 °Elsevier Interactive Patient Education ©2016 [employer] ° °

## 2015-07-18 NOTE — ED Provider Notes (Signed)
[facility] Emergency Department Provider Note ?  ? ____________________________________________ ? Time seen:  ? I have reviewed the triage vital signs and the nursing notes.  ________ HISTORY ? Chief Complaint Numbness; Nasal Congestion; and Back Pain     HPI  Elizabeth Horton is a 29 y.o. female   who presents emergency department complaining of lumbar back pain. She states that she has had some low back pain as well as numbness in the lateral aspect of her left foot 3 weeks. She denies any injury precipitating these symptoms. Patient has taken Tylenol and ibuprofen sporadically with some relief. Patient states that symptoms have not fully resolved. Patient denies any saddle anesthesia, paresthesias, or bowel or bladder dysfunction. ? ? ? Past Medical History  Diagnosis Date  . No pertinent past medical history   . Abnormal pap   . Pneumonia     2013  . Abnormal Pap smear     LEEP 2011    Patient Active Problem List   Diagnosis Date Noted  . S/P cesarean section 07/15/2013  . H/O shoulder dystocia in prior pregnancy, currently pregnant 12/20/2012   ? Past Surgical History  Procedure Laterality Date  . Leep    . Mouth surgery  2014    wisdom teeth removed  . Cesarean section N/A 07/12/2013    Procedure: CESAREAN SECTION;  Surgeon: Tereso NewcomerUgonna A Anyanwu, MD;  Location: [facility];  Service: Obstetrics;  Laterality: N/A;   ? Current Outpatient Rx  Name  Route  Sig  Dispense  Refill  . FENUGREEK PO   Oral   Take by mouth.         Marland Kitchen. ibuprofen (ADVIL,MOTRIN) 600 MG tablet   Oral   Take 1 tablet (600 mg total) by mouth every 6 (six) hours.   30 tablet   0   . meloxicam (MOBIC) 15 MG tablet   Oral   Take 1 tablet (15 mg total) by mouth daily.   30 tablet   0   . norethindrone (MICRONOR,CAMILA,ERRIN) 0.35 MG tablet   Oral   Take 1 tablet (0.35 mg total) by mouth daily.   1 Package   2   . oxyCODONE-acetaminophen (PERCOCET/ROXICET)  5-325 MG per tablet   Oral   Take 1-2 tablets by mouth every 4 (four) hours as needed for severe pain (moderate - severe pain).   30 tablet   0   . prenatal vitamin w/FE, FA (PRENATAL 1 + 1) 27-1 MG TABS   Oral   Take 1 tablet by mouth daily.   30 each   5    ? Allergies Review of patient's allergies indicates no known allergies. ? Family History  Problem Relation Age of Onset  . Lung cancer Paternal Grandfather     7860s  . Breast cancer Paternal Grandmother     1160s  . Breast cancer Paternal Aunt   . Colon cancer Father     age 29 diagnosed, succumbed at age 29  . Hypertension Mother   . Thyroid disease Mother   . Colon cancer Sister     Three tumors found at time of colonoscopy. age 936.  . Breast cancer Other     aunt  . Diabetes Maternal Grandmother   . Diabetes Maternal Grandfather    ? Social History Social History  Substance Use Topics  . Smoking status: Never Smoker   . Smokeless tobacco: Never Used  . Alcohol Use: No   ? Review of Systems Constitutional: no  fever. Eyes: no discharge ENT: no sore throat. Cardiovascular: no chest pain. Respiratory: no cough. No sob Gastrointestinal: denies abdominal pain, vomiting, diarrhea, and constipation Genitourinary: no dysuria. Negative for hematuria Musculoskeletal: Patient endorses lumbar back pain with radicular symptoms.. Skin: Negative for rash. Neurological: Negative for headaches  10-point ROS otherwise negative.  _______________ PHYSICAL EXAM: ? VITAL SIGNS:   ED Triage Vitals  Enc Vitals Group     BP 07/18/15 2011 135/79 mmHg     Pulse Rate 07/18/15 2011 109     Resp --      Temp 07/18/15 2011 98.4 F (36.9 C)     Temp Source 07/18/15 2011 Oral     SpO2 07/18/15 2011 99 %     Weight 07/18/15 2011 148 lb (67.132 kg)     Height 07/18/15 2011  (1.6 m)     Head Cir --      Peak Flow --      Pain Score 07/18/15 2019 4     Pain Loc --      Pain Edu? --      Excl. in GC? --     ?  Constitutional: Alert and oriented. Well appearing and in no distress. Eyes: Conjunctivae are normal.  ENT      Head: Normocephalic and atraumatic.      Ears:       Nose: No congestion/rhinnorhea.      Mouth/Throat: Mucous membranes are moist.       Hematological/Lymphatic/Immunilogical: No cervical lymphadenopathy. Cardiovascular: Normal rate, regular rhythm.  Respiratory: Normal respiratory effort without tachypnea nor retractions. Gastrointestinal: Soft and nontender. No distention. There is no CVA tenderness. Genitourinary:  Musculoskeletal: Nontender with normal range of motion in all extremities.  no visible deformity to inspection. Patient is nontender to palpation midline spinal processes. Patient is diffusely tender to palpation paraspinal muscle group left side. Full range of motion to back. Positive straight leg raise left side. Sensation and pulses are intact bilateral lower extremities.  Neurologic:  Normal speech and language. No gross focal neurologic deficits are appreciated. Skin:  Skin is warm, dry and intact. No rash noted. Psychiatric: Mood and affect are normal. Speech and behavior are normal. Patient exhibits appropriate insight and judgment.    ___________ RADIOLOGY   lumbar spine x-ray Impression: No acute osseous abnormality.   I personally reviewed the images.   _____________ PROCEDURES ? Procedure(s) performed:     ______________________________________________________ INITIAL IMPRESSION / ASSESSMENT AND PLAN / ED COURSE ? Pertinent labs & imaging results that were available during my care of the patient were reviewed by me and considered in my medical decision making (see chart for details).   Patient's diagnosis is consistent with sciatica to the left leg. Patient is informed of diagnosis and verbalizes understanding of same. She'll be placed on anti-inflammatories for symptom control. She will follow-up with orthopedics should symptoms  persist past this treatment course.   New Prescriptions   MELOXICAM (MOBIC) 15 MG TABLET    Take 1 tablet (15 mg total) by mouth daily.   ____________________________________________ FINAL CLINICAL IMPRESSION(S) / ED DIAGNOSES?  Final diagnoses:  Sciatica of left side       Racheal Patches, PA-C 07/18/15 2137  Loleta Rose, MD 07/19/15 [id number]

## 2015-08-29 ENCOUNTER — Other Ambulatory Visit: Payer: Medicaid Other | Admitting: Women's Health

## 2015-09-04 ENCOUNTER — Other Ambulatory Visit: Payer: Medicaid Other | Admitting: Advanced Practice Midwife

## 2015-09-14 ENCOUNTER — Ambulatory Visit (INDEPENDENT_AMBULATORY_CARE_PROVIDER_SITE_OTHER): Payer: Self-pay | Admitting: Family Medicine

## 2015-09-14 VITALS — BP 118/80 | HR 84 | Temp 98.4°F | Resp 18 | Ht 65.25 in | Wt 158.0 lb

## 2015-09-14 DIAGNOSIS — K029 Dental caries, unspecified: Secondary | ICD-10-CM

## 2015-09-14 DIAGNOSIS — K0889 Other specified disorders of teeth and supporting structures: Secondary | ICD-10-CM

## 2015-09-14 MED ORDER — HYDROCODONE-ACETAMINOPHEN 5-325 MG PO TABS: 1.0000 | ORAL_TABLET | Freq: Four times a day (QID) | ORAL | Status: DC | PRN

## 2015-09-14 MED ORDER — AMOXICILLIN 500 MG PO CAPS: 500.0000 mg | ORAL_CAPSULE | Freq: Three times a day (TID) | ORAL | Status: DC

## 2015-09-14 NOTE — Progress Notes (Signed)
 Chief Complaint:  Chief Complaint  Patient presents with  . Dental Pain    Lower back teeth are hurting. Wants an antibiotic    HPI: Elizabeth Horton is a 30 y.o. female who reports to [facility] today complaining of  Dental pain in right lower moalr, she ahs ahd cold and heat sensitivities, they are leaving out of town to go to [place] and she deos not want to ruin her vacation. No fevers or chills. She has an appt after she [place] from [place] to get it pulled with dentist. HAs tried expired ibuprofen without releif.   Past Medical History  Diagnosis Date  . No pertinent past medical history   . Abnormal pap   . Pneumonia     2013  . Abnormal Pap smear     LEEP 2011   Past Surgical History  Procedure Laterality Date  . Leep    . Mouth surgery  2014    wisdom teeth removed  . Cesarean section N/A 07/12/2013    Procedure: CESAREAN SECTION;  Surgeon: Tereso Newcomer, MD;  Location: [facility];  Service: Obstetrics;  Laterality: N/A;   Social History   Social History  . Marital Status: Single    Spouse Name: N/A  . Number of Children: 1  . Years of Education: N/A   Occupational History  . PT-cleaning service   . PT, cleans houses    Social History Main Topics  . Smoking status: Never Smoker   . Smokeless tobacco: Never Used  . Alcohol Use: No  . Drug Use: No  . Sexual Activity: Not Currently    Birth Control/ Protection: None   Other Topics Concern  . None   Social History Narrative   ** Merged History Encounter **       Family History  Problem Relation Age of Onset  . Lung cancer Paternal Grandfather     59s  . Breast cancer Paternal Grandmother     34s  . Breast cancer Paternal Aunt   . Colon cancer Father     age 28 diagnosed, succumbed at age 21  . Hypertension Mother   . Thyroid disease Mother   . Colon cancer Sister     Three tumors found at time of colonoscopy. age 69.  . Breast cancer Other     aunt  . Diabetes Maternal Grandmother   .  Diabetes Maternal Grandfather    No Known Allergies Prior to Admission medications   Medication Sig Start Date End Date Taking? Authorizing Provider  ibuprofen (ADVIL,MOTRIN) 600 MG tablet Take 1 tablet (600 mg total) by mouth every 6 (six) hours. 07/20/13  Yes Lazaro Arms, MD  FENUGREEK PO Take by mouth. Reported on 09/14/2015    Historical Provider, MD  meloxicam (MOBIC) 15 MG tablet Take 1 tablet (15 mg total) by mouth daily. Patient not taking: Reported on 09/14/2015 07/18/15   Delorise Royals Cuthriell, PA-C  norethindrone (MICRONOR,CAMILA,ERRIN) 0.35 MG tablet Take 1 tablet (0.35 mg total) by mouth daily. Patient not taking: Reported on 09/14/2015 07/14/13   Vale Haven, MD  oxyCODONE-acetaminophen (PERCOCET/ROXICET) 5-325 MG per tablet Take 1-2 tablets by mouth every 4 (four) hours as needed for severe pain (moderate - severe pain). Patient not taking: Reported on 09/14/2015 07/20/13   Lazaro Arms, MD  prenatal vitamin w/FE, FA (PRENATAL 1 + 1) 27-1 MG TABS Take 1 tablet by mouth daily. Patient not taking: Reported on 09/14/2015 06/21/11   Jeannett Senior  Samuella Bruin, MD     ROS: The patient denies fevers, chills, night sweats, unintentional weight loss, chest pain, palpitations, wheezing, dyspnea on exertion, nausea, vomiting, abdominal pain, dysuria, hematuria, melena, numbness, weakness, or tingling.   All other systems have been reviewed and were otherwise negative with the exception of those mentioned in the HPI and as above.    PHYSICAL EXAM: Filed Vitals:   09/14/15 1444  BP: 118/80  Pulse: 84  Temp: 98.4 F (36.9 C)  Resp: 18   Body mass index is 26.1 kg/(m^2).   General: Alert, no acute distress HEENT:  Normocephalic, atraumatic, oropharynx patent. EOMI, PERRLA + right lower molar chipped and decayed, no gingival swelling Cardiovascular:  Regular rate and rhythm, no rubs murmurs or gallops.    Respiratory: Clear to auscultation bilaterally.  No wheezes, rales, or rhonchi.  No  cyanosis, no use of accessory musculature Abdominal: No organomegaly, abdomen is soft and non-tender, positive bowel sounds. No masses. Skin: No rashes. Neurologic: Facial musculature symmetric. Psychiatric: Patient acts appropriately throughout our interaction. Lymphatic: No cervical or submandibular lymphadenopathy Musculoskeletal: Gait intact. No edema, tenderness   LABS: Results for orders placed or performed during the hospital encounter of 07/18/15  Pregnancy, urine POC  Result Value Ref Range   Preg Test, Ur NEGATIVE NEGATIVE     EKG/XRAY:   Primary read interpreted by Dr. Conley Rolls at [facility]   ASSESSMENT/PLAN: Encounter Diagnoses  Name Primary?  . Pain, dental Yes  . Dental caries    Rx amoxacillin Otc ibuprofen, if pain not well controlled then use norco with precautions She is not pregnant Fu prn , go see dentist  Gross sideeffects, risk and benefits, and alternatives of medications d/w patient. Patient is aware that all medications have potential sideeffects and we are unable to predict every sideeffect or drug-drug interaction that may occur.    DO  09/14/2015 2:55 PM

## 2016-10-17 ENCOUNTER — Encounter: Payer: Self-pay | Admitting: Emergency Medicine

## 2016-10-17 ENCOUNTER — Emergency Department
Admission: EM | Admit: 2016-10-17 | Discharge: 2016-10-17 | Disposition: A | Discharge status: Home / Self Care | Payer: Self-pay | Attending: Emergency Medicine | Admitting: Emergency Medicine

## 2016-10-17 ENCOUNTER — Emergency Department: Payer: Self-pay

## 2016-10-17 DIAGNOSIS — R112 Nausea with vomiting, unspecified: Secondary | ICD-10-CM

## 2016-10-17 DIAGNOSIS — Z3A13 13 weeks gestation of pregnancy: Secondary | ICD-10-CM | POA: Insufficient documentation

## 2016-10-17 DIAGNOSIS — O219 Vomiting of pregnancy, unspecified: Secondary | ICD-10-CM | POA: Insufficient documentation

## 2016-10-17 DIAGNOSIS — R197 Diarrhea, unspecified: Secondary | ICD-10-CM | POA: Insufficient documentation

## 2016-10-17 LAB — CBC
HCT: 36.1 % (ref 35.0–47.0)
Hemoglobin: 12.3 g/dL (ref 12.0–16.0)
MCH: 28.9 pg (ref 26.0–34.0)
MCHC: 34.1 g/dL (ref 32.0–36.0)
MCV: 84.7 fL (ref 80.0–100.0)
Platelets: 268 10*3/uL (ref 150–440)
RBC: 4.26 MIL/uL (ref 3.80–5.20)
RDW: 13.7 % (ref 11.5–14.5)
WBC: 7.4 10*3/uL (ref 3.6–11.0)

## 2016-10-17 LAB — URINALYSIS, COMPLETE (UACMP) WITH MICROSCOPIC
Bacteria, UA: NONE SEEN
Bilirubin Urine: NEGATIVE
Glucose, UA: NEGATIVE mg/dL
Hgb urine dipstick: NEGATIVE
Ketones, ur: NEGATIVE mg/dL
Leukocytes, UA: NEGATIVE
Nitrite: NEGATIVE
Protein, ur: NEGATIVE mg/dL
RBC / HPF: NONE SEEN RBC/hpf (ref 0–5)
Specific Gravity, Urine: 1.008 (ref 1.005–1.030)
pH: 6 (ref 5.0–8.0)

## 2016-10-17 LAB — COMPREHENSIVE METABOLIC PANEL
ALT: 10 U/L — ABNORMAL LOW (ref 14–54)
AST: 20 U/L (ref 15–41)
Albumin: 3.8 g/dL (ref 3.5–5.0)
Alkaline Phosphatase: 38 U/L (ref 38–126)
Anion gap: 8 (ref 5–15)
BUN: 5 mg/dL — ABNORMAL LOW (ref 6–20)
CO2: 20 mmol/L — ABNORMAL LOW (ref 22–32)
Calcium: 9.2 mg/dL (ref 8.9–10.3)
Chloride: 105 mmol/L (ref 101–111)
Creatinine, Ser: 0.66 mg/dL (ref 0.44–1.00)
GFR calc Af Amer: >60 mL/min (ref >60)
GFR calc non Af Amer: >60 mL/min (ref >60)
Glucose, Bld: 99 mg/dL (ref 65–99)
Potassium: 3.6 mmol/L (ref 3.5–5.1)
Sodium: 133 mmol/L — ABNORMAL LOW (ref 135–145)
Total Bilirubin: 1 mg/dL (ref 0.3–1.2)
Total Protein: 8.3 g/dL — ABNORMAL HIGH (ref 6.5–8.1)

## 2016-10-17 LAB — HCG, QUANTITATIVE, PREGNANCY: hCG, Beta Chain, Quant, S: 57249 m[IU]/mL — ABNORMAL HIGH (ref <5)

## 2016-10-17 LAB — LIPASE, BLOOD: Lipase: 21 U/L (ref 11–51)

## 2016-10-17 NOTE — ED Provider Notes (Signed)
[facility] Emergency Department Provider Note   ____________________________________________    I have reviewed the triage vital signs and the nursing notes.   HISTORY  Chief Complaint Emesis     HPI Elizabeth Horton is a 31 y.o. female who presents with complaints of nausea and vomiting 2 as well as an episode of diarrhea. Patient reports over the last several days has felt quite nauseous and had 2 episodes of vomiting. She also had diarrhea as well which at the time was associated with cramps but those have resolved. She is [redacted] weeks pregnant. She has not seen OB yet. No vaginal bleeding. No fevers or chills.   Past Medical History:  Diagnosis Date  . Abnormal pap   . Abnormal Pap smear    LEEP 2011  . No pertinent past medical history   . Pneumonia    2013    Patient Active Problem List   Diagnosis Date Noted  . S/P cesarean section 07/15/2013  . H/O shoulder dystocia in prior pregnancy, currently pregnant 12/20/2012    Past Surgical History:  Procedure Laterality Date  . CESAREAN SECTION N/A 07/12/2013   Procedure: CESAREAN SECTION;  Surgeon: Tereso Newcomer, MD;  Location: [facility];  Service: Obstetrics;  Laterality: N/A;  . LEEP    . MOUTH SURGERY  2014   wisdom teeth removed    Prior to Admission medications   Medication Sig Start Date End Date Taking? Authorizing Provider  amoxicillin (AMOXIL) 500 MG capsule Take 1 capsule (500 mg total) by mouth 3 (three) times daily. 09/14/15   Thao P Le, DO  FENUGREEK PO Take by mouth. Reported on 09/14/2015    Historical Provider, MD  HYDROcodone-acetaminophen (NORCO) 5-325 MG tablet Take 1 tablet by mouth every 6 (six) hours as needed for moderate pain. Take with stool softener, no other tylenol this. 09/14/15   Thao P Le, DO  ibuprofen (ADVIL,MOTRIN) 600 MG tablet Take 1 tablet (600 mg total) by mouth every 6 (six) hours. 07/20/13   Lazaro Arms, MD  meloxicam (MOBIC) 15 MG tablet Take 1  tablet (15 mg total) by mouth daily. Patient not taking: Reported on 09/14/2015 07/18/15   Delorise Royals Cuthriell, PA-C     Allergies Patient has no known allergies.  Family History  Problem Relation Age of Onset  . Colon cancer Father     age 71 diagnosed, succumbed at age 32  . Colon cancer Sister     Three tumors found at time of colonoscopy. age 62.  . Lung cancer Paternal Grandfather     31s  . Breast cancer Paternal Grandmother     64s  . Breast cancer Paternal Aunt   . Hypertension Mother   . Thyroid disease Mother   . Breast cancer Other     aunt  . Diabetes Maternal Grandmother   . Diabetes Maternal Grandfather     Social History Social History  Substance Use Topics  . Smoking status: Never Smoker  . Smokeless tobacco: Never Used  . Alcohol use No    Review of Systems  Constitutional: No fever/chills   Cardiovascular: Denies chest pain. Respiratory: Denies shortness of breath. GastrointestinalAs above Genitourinary: Negative for dysuria. Musculoskeletal: Negative for back pain. Skin: Negative for rash. Neurological: Negative for headaches  10-point ROS otherwise negative.  ____________________________________________   PHYSICAL EXAM:  VITAL SIGNS: ED Triage Vitals  Enc Vitals Group     BP 10/17/16 1306 123/74     Pulse  Rate 10/17/16 1306 (!) 110     Resp 10/17/16 1306 (!) 2     Temp 10/17/16 1306 97.2 F (36.2 C)     Temp Source 10/17/16 1306 Oral     SpO2 10/17/16 1306 100 %     Weight 10/17/16 1303 158 lb (71.7 kg)     Height --      Head Circumference --      Peak Flow --      Pain Score 10/17/16 1303 0     Pain Loc --      Pain Edu? --      Excl. in GC? --     Constitutional: Alert and oriented. No acute distress. Pleasant and interactive Eyes: Conjunctivae are normal.   Nose: No congestion/rhinnorhea. Mouth/Throat: Mucous membranes are moist.    Cardiovascular: Normal rate, regular rhythm. Grossly normal heart sounds.  Good  peripheral circulation. Respiratory: Normal respiratory effort.  No retractions. Lungs CTAB. Gastrointestinal: Soft and nontender. No distention.  No CVA tenderness. Genitourinary: deferred Musculoskeletal: No lower extremity tenderness nor edema.  Warm and well perfused Neurologic:  Normal speech and language. No gross focal neurologic deficits are appreciated.  Skin:  Skin is warm, dry and intact. No rash noted. Psychiatric: Mood and affect are normal. Speech and behavior are normal.  ____________________________________________   LABS (all labs ordered are listed, but only abnormal results are displayed)  Labs Reviewed  COMPREHENSIVE METABOLIC PANEL - Abnormal; Notable for the following:       Result Value   Sodium 133 (*)    CO2 20 (*)    BUN 5 (*)    Total Protein 8.3 (*)    ALT 10 (*)    All other components within normal limits  URINALYSIS, COMPLETE (UACMP) WITH MICROSCOPIC - Abnormal; Notable for the following:    Color, Urine YELLOW (*)    APPearance HAZY (*)    Squamous Epithelial / LPF 0-5 (*)    All other components within normal limits  HCG, QUANTITATIVE, PREGNANCY - Abnormal; Notable for the following:    hCG, Beta Chain, Quant, S 57,249 (*)    All other components within normal limits  LIPASE, BLOOD  CBC   ____________________________________________  EKG  None ____________________________________________  RADIOLOGY  Ultrasound shows IUP ____________________________________________   PROCEDURES  Procedure(s) performed: No    Critical Care performed: No ____________________________________________   INITIAL IMPRESSION / ASSESSMENT AND PLAN / ED COURSE  Pertinent labs & imaging results that were available during my care of the patient were reviewed by me and considered in my medical decision making (see chart for details).  Patient well-appearing and in no acute distress. She reports most of her symptoms have improved at this time. She no  longer feels nauseous and has no abdominal pain. She has not had an ultrasound and has had crampy abdominal pain we will obtain ultrasound in the emergency department. Lab work is reassuring.  Ultrasound unremarkable. Patient feels quite well, offered nausea medication which she declined. She will follow-up with OB. Return precautions discussed    ____________________________________________   FINAL CLINICAL IMPRESSION(S) / ED DIAGNOSES  Final diagnoses:  Nausea vomiting and diarrhea      NEW MEDICATIONS STARTED DURING THIS VISIT:  Discharge Medication List as of 10/17/2016  5:24 PM       Note:  This document was prepared using Dragon voice recognition software and may include unintentional dictation errors.    Jene Every, MD 10/17/16 [phone]

## 2016-10-17 NOTE — ED Triage Notes (Signed)
Pt to ed with c/o abd pain, diarrhea and nausea x 2 days. Pt states she is approx [redacted] weeks pregnant.

## 2018-03-31 ENCOUNTER — Inpatient Hospital Stay (HOSPITAL_COMMUNITY)
Admission: EM | Admit: 2018-03-31 | Discharge: 2018-04-04 | DRG: 871 | Disposition: A | Discharge status: Home / Self Care | Payer: Medicaid Other | Attending: Family Medicine | Admitting: Family Medicine

## 2018-03-31 ENCOUNTER — Other Ambulatory Visit: Payer: Self-pay

## 2018-03-31 ENCOUNTER — Encounter (HOSPITAL_COMMUNITY): Payer: Self-pay | Admitting: Emergency Medicine

## 2018-03-31 ENCOUNTER — Emergency Department (HOSPITAL_COMMUNITY): Payer: Medicaid Other

## 2018-03-31 DIAGNOSIS — N289 Disorder of kidney and ureter, unspecified: Secondary | ICD-10-CM

## 2018-03-31 DIAGNOSIS — Z8349 Family history of other endocrine, nutritional and metabolic diseases: Secondary | ICD-10-CM | POA: Diagnosis not present

## 2018-03-31 DIAGNOSIS — N281 Cyst of kidney, acquired: Secondary | ICD-10-CM | POA: Diagnosis present

## 2018-03-31 DIAGNOSIS — D62 Acute posthemorrhagic anemia: Secondary | ICD-10-CM | POA: Diagnosis present

## 2018-03-31 DIAGNOSIS — Z8249 Family history of ischemic heart disease and other diseases of the circulatory system: Secondary | ICD-10-CM

## 2018-03-31 DIAGNOSIS — A419 Sepsis, unspecified organism: Secondary | ICD-10-CM | POA: Diagnosis present

## 2018-03-31 DIAGNOSIS — J189 Pneumonia, unspecified organism: Secondary | ICD-10-CM

## 2018-03-31 DIAGNOSIS — N920 Excessive and frequent menstruation with regular cycle: Secondary | ICD-10-CM | POA: Diagnosis present

## 2018-03-31 DIAGNOSIS — J181 Lobar pneumonia, unspecified organism: Secondary | ICD-10-CM

## 2018-03-31 DIAGNOSIS — N179 Acute kidney failure, unspecified: Secondary | ICD-10-CM | POA: Diagnosis present

## 2018-03-31 DIAGNOSIS — Z800 Family history of malignant neoplasm of digestive organs: Secondary | ICD-10-CM | POA: Diagnosis not present

## 2018-03-31 DIAGNOSIS — Z801 Family history of malignant neoplasm of trachea, bronchus and lung: Secondary | ICD-10-CM | POA: Diagnosis not present

## 2018-03-31 DIAGNOSIS — Z803 Family history of malignant neoplasm of breast: Secondary | ICD-10-CM | POA: Diagnosis not present

## 2018-03-31 DIAGNOSIS — Z833 Family history of diabetes mellitus: Secondary | ICD-10-CM | POA: Diagnosis not present

## 2018-03-31 LAB — COMPREHENSIVE METABOLIC PANEL
ALT: 13 U/L (ref 0–44)
AST: 19 U/L (ref 15–41)
Albumin: 3.9 g/dL (ref 3.5–5.0)
Alkaline Phosphatase: 67 U/L (ref 38–126)
Anion gap: 10 (ref 5–15)
BUN: 7 mg/dL (ref 6–20)
CO2: 19 mmol/L — ABNORMAL LOW (ref 22–32)
Calcium: 8.5 mg/dL — ABNORMAL LOW (ref 8.9–10.3)
Chloride: 103 mmol/L (ref 98–111)
Creatinine, Ser: 1.05 mg/dL — ABNORMAL HIGH (ref 0.44–1.00)
GFR calc Af Amer: >60 mL/min (ref >60)
GFR calc non Af Amer: >60 mL/min (ref >60)
Glucose, Bld: 105 mg/dL — ABNORMAL HIGH (ref 70–99)
Potassium: 3.4 mmol/L — ABNORMAL LOW (ref 3.5–5.1)
Sodium: 132 mmol/L — ABNORMAL LOW (ref 135–145)
Total Bilirubin: 1.7 mg/dL — ABNORMAL HIGH (ref 0.3–1.2)
Total Protein: 8.3 g/dL — ABNORMAL HIGH (ref 6.5–8.1)

## 2018-03-31 LAB — URINALYSIS, ROUTINE W REFLEX MICROSCOPIC
Bilirubin Urine: NEGATIVE
Glucose, UA: NEGATIVE mg/dL
Hgb urine dipstick: NEGATIVE
Ketones, ur: 20 mg/dL — AB
Leukocytes, UA: NEGATIVE
Nitrite: NEGATIVE
Protein, ur: NEGATIVE mg/dL
Specific Gravity, Urine: >1.046 — ABNORMAL HIGH (ref 1.005–1.030)
pH: 6 (ref 5.0–8.0)

## 2018-03-31 LAB — LACTIC ACID, PLASMA
Lactic Acid, Venous: 1.2 mmol/L (ref 0.5–1.9)
Lactic Acid, Venous: 1.5 mmol/L (ref 0.5–1.9)

## 2018-03-31 LAB — CBC
HCT: 35.6 % — ABNORMAL LOW (ref 36.0–46.0)
Hemoglobin: 11.7 g/dL — ABNORMAL LOW (ref 12.0–15.0)
MCH: 27.8 pg (ref 26.0–34.0)
MCHC: 32.9 g/dL (ref 30.0–36.0)
MCV: 84.6 fL (ref 78.0–100.0)
Platelets: 294 10*3/uL (ref 150–400)
RBC: 4.21 MIL/uL (ref 3.87–5.11)
RDW: 14.5 % (ref 11.5–15.5)
WBC: 22.4 10*3/uL — ABNORMAL HIGH (ref 4.0–10.5)

## 2018-03-31 LAB — HCG, QUANTITATIVE, PREGNANCY: hCG, Beta Chain, Quant, S: <1 m[IU]/mL (ref <5)

## 2018-03-31 LAB — LIPASE, BLOOD: Lipase: 24 U/L (ref 11–51)

## 2018-03-31 MED ORDER — VANCOMYCIN HCL 500 MG IV SOLR
INTRAVENOUS | Status: AC
  Filled 2018-03-31: qty 500

## 2018-03-31 MED ORDER — VANCOMYCIN HCL 10 G IV SOLR
INTRAVENOUS | Status: AC
  Filled 2018-03-31: qty 1500

## 2018-03-31 MED ORDER — SODIUM CHLORIDE 0.9 % IV SOLN
1.0000 g | Freq: Once | INTRAVENOUS | Status: AC
  Administered 2018-03-31: 1 g via INTRAVENOUS
  Filled 2018-03-31: qty 10

## 2018-03-31 MED ORDER — KETOROLAC TROMETHAMINE 30 MG/ML IJ SOLN
30.0000 mg | Freq: Once | INTRAMUSCULAR | Status: AC
  Administered 2018-03-31: 30 mg via INTRAVENOUS
  Filled 2018-03-31: qty 1

## 2018-03-31 MED ORDER — ALUM & MAG HYDROXIDE-SIMETH 200-200-20 MG/5ML PO SUSP: 30.0000 mL | ORAL | Status: DC | PRN

## 2018-03-31 MED ORDER — ONDANSETRON HCL 4 MG/2ML IJ SOLN
4.0000 mg | Freq: Once | INTRAMUSCULAR | Status: AC
Start: 2018-03-31 — End: 2018-03-31
  Administered 2018-03-31: 4 mg via INTRAVENOUS
  Filled 2018-03-31: qty 2

## 2018-03-31 MED ORDER — ONDANSETRON HCL 4 MG/2ML IJ SOLN
4.0000 mg | Freq: Four times a day (QID) | INTRAMUSCULAR | Status: DC | PRN
  Administered 2018-04-02: 4 mg via INTRAVENOUS
  Filled 2018-03-31: qty 2

## 2018-03-31 MED ORDER — ACETAMINOPHEN 325 MG PO TABS
650.0000 mg | ORAL_TABLET | Freq: Four times a day (QID) | ORAL | Status: DC | PRN
  Administered 2018-03-31 – 2018-04-03 (×7): 650 mg via ORAL
  Filled 2018-03-31 (×7): qty 2

## 2018-03-31 MED ORDER — AZITHROMYCIN 250 MG PO TABS
500.0000 mg | ORAL_TABLET | ORAL | Status: DC
  Administered 2018-03-31 – 2018-04-03 (×4): 500 mg via ORAL
  Filled 2018-03-31 (×4): qty 2

## 2018-03-31 MED ORDER — VANCOMYCIN HCL 1000 MG IV SOLR
INTRAVENOUS | Status: AC
  Filled 2018-03-31: qty 1000

## 2018-03-31 MED ORDER — SODIUM CHLORIDE 0.9 % IV SOLN
2.0000 g | INTRAVENOUS | Status: DC
  Administered 2018-04-01 – 2018-04-04 (×4): 2 g via INTRAVENOUS
  Filled 2018-03-31 (×2): qty 2
  Filled 2018-03-31: qty 20
  Filled 2018-03-31 (×2): qty 2

## 2018-03-31 MED ORDER — VANCOMYCIN HCL 10 G IV SOLR
1750.0000 mg | Freq: Once | INTRAVENOUS | Status: DC
  Filled 2018-03-31: qty 1750

## 2018-03-31 MED ORDER — IOPAMIDOL (ISOVUE-300) INJECTION 61%
100.0000 mL | Freq: Once | INTRAVENOUS | Status: AC | PRN
  Administered 2018-03-31: 100 mL via INTRAVENOUS

## 2018-03-31 MED ORDER — LACTATED RINGERS IV BOLUS
1000.0000 mL | Freq: Once | INTRAVENOUS | Status: AC
  Administered 2018-03-31: 1000 mL via INTRAVENOUS

## 2018-03-31 MED ORDER — FENTANYL CITRATE (PF) 100 MCG/2ML IJ SOLN
50.0000 ug | Freq: Once | INTRAMUSCULAR | Status: AC
  Administered 2018-03-31: 50 ug via INTRAVENOUS
  Filled 2018-03-31: qty 2

## 2018-03-31 MED ORDER — HYDROCODONE-ACETAMINOPHEN 5-325 MG PO TABS
1.0000 | ORAL_TABLET | Freq: Four times a day (QID) | ORAL | Status: DC | PRN
  Administered 2018-04-01 – 2018-04-04 (×6): 1 via ORAL
  Filled 2018-03-31 (×8): qty 1

## 2018-03-31 MED ORDER — SODIUM CHLORIDE 0.9 % IV SOLN
INTRAVENOUS | Status: DC
  Administered 2018-03-31: 21:00:00 via INTRAVENOUS

## 2018-03-31 NOTE — ED Notes (Signed)
300 RN to contacted for report-she will call me back.

## 2018-03-31 NOTE — H&P (Addendum)
History and Physical    Elizabeth Horton DDU:[id number] DOB: 07/26/1985 DOA: 03/31/2018  PCP: Patient, No Pcp Per  Patient coming from: home  I have personally briefly reviewed patient's old medical records in [facility]  Chief Complaint: pleuritic CP, RIGHT FLANK PAIN   HPI: Elizabeth Horton is a 32 y.o. female with no medical history  Presents with pleuritic CP and right flank pain. She had some dysuria and went to UC on yesterday.  She was Dx with UTi and she took 1 dose of Abx . She cont to have right flank pain and pleuritic chest discomfort and came into ED today.  She had a abdomen and pelvic CT that showed complex cyst in the upper pole of the right kidney now containing enhanced state septations as well as patchy airspace disease in the posterior basilar right lower lobe.  She states that 2 of her children are sick with URI symptoms.  She denies any traveling.  She did get a use air-conditioner recently installed in her home.   Patient gave a birth to a little girl about 11 months ago and she is currently breast-feeding.  ED Course: iv rocephin, 2 liters , iv pain meds   Review of Systems: + flank pain, fever, dysuria, cp, vomit x 1 All others reviewed with patient  and are  negative unless otherwise stated    Past Medical History:  Diagnosis Date  . Abnormal pap   . Abnormal Pap smear    LEEP 2011  . No pertinent past medical history   . Pneumonia    2013    Past Surgical History:  Procedure Laterality Date  . CESAREAN SECTION N/A 07/12/2013   Procedure: CESAREAN SECTION;  Surgeon: Tereso Newcomer, MD;  Location: [facility];  Service: Obstetrics;  Laterality: N/A;  . LEEP    . MOUTH SURGERY  2014   wisdom teeth removed     reports that she has never smoked. She has never used smokeless tobacco. She reports that she does not drink alcohol or use drugs. 3 kids, newest 81 mo old girl, unemployed   No Known Allergies  Family History  Problem Relation Age of  Onset  . Colon cancer Father        age 75 diagnosed, succumbed at age 75  . Colon cancer Sister        Three tumors found at time of colonoscopy. age 73.  . Lung cancer Paternal Grandfather        65s  . Breast cancer Paternal Grandmother        6s  . Breast cancer Paternal Aunt   . Hypertension Mother   . Thyroid disease Mother   . Breast cancer Other        aunt  . Diabetes Maternal Grandmother   . Diabetes Maternal Grandfather   reviewed    Prior to Admission medications   Not on File    Physical Exam: Vitals:   03/31/18 1602 03/31/18 1700 03/31/18 1800 03/31/18 1900  BP: 107/61 118/70 115/72 106/68  Pulse: 100 (!) 108 (!) 109 (!) 110  Resp: 12 15 16 17   Temp:      TempSrc:      SpO2: 100% 100% 99% 99%  Weight:      Height:        Constitutional: NAD, calm, comfortable Vitals:   03/31/18 1602 03/31/18 1700 03/31/18 1800 03/31/18 1900  BP: 107/61 118/70 115/72 106/68  Pulse: 100 (!) 108 (!)  109 (!) 110  Resp: 12 15 16 17   Temp:      TempSrc:      SpO2: 100% 100% 99% 99%  Weight:      Height:       Eyes: PERRL, lids and conjunctivae normal ENMT: Mucous membranes are moist. Posterior pharynx clear of any exudate or lesions.Normal dentition.  Neck: normal, supple, no masses,  Respiratory: decr right base  no wheezing, no crackles. Normal respiratory effort. No accessory muscle use.  Cardiovascular: tachy rate and reg rhythm, no murmurs / rubs / gallops. No extremity edema. 2+ pedal pulses. .  Abdomen: no tenderness, no masses palpated. No hepatosplenomegaly. Bowel sounds positive.  No flank pain  Musculoskeletal: no clubbing / cyanosis. No joint deformity upper and lower extremities. Good ROM, no contractures. Normal muscle tone.  Skin: no rashes, lesions, ulcers. No induration Neurologic: CN 2-12 grossly intact. Sensation intact,. Strength 5/5 in all 4.  Psychiatric: Normal judgment and insight. Alert and oriented x 3. Normal mood.   (Labs on Admission: I  have personally reviewed following labs and imaging studies  CBC: Recent Labs  Lab 03/31/18 1459  WBC 22.4*  HGB 11.7*  HCT 35.6*  MCV 84.6  PLT 294   Basic Metabolic Panel: Recent Labs  Lab 03/31/18 1459  NA 132*  K 3.4*  CL 103  CO2 19*  GLUCOSE 105*  BUN 7  CREATININE 1.05*  CALCIUM 8.5*   GFR: Estimated Creatinine Clearance: 81.4 mL/min (A) (by C-G formula based on SCr of 1.05 mg/dL (H)). Liver Function Tests: Recent Labs  Lab 03/31/18 1459  AST 19  ALT 13  ALKPHOS 67  BILITOT 1.7*  PROT 8.3*  ALBUMIN 3.9   Recent Labs  Lab 03/31/18 1459  LIPASE 24   No results for input(s): AMMONIA in the last 168 hours. Coagulation Profile: No results for input(s): INR, PROTIME in the last 168 hours. Cardiac Enzymes: No results for input(s): CKTOTAL, CKMB, CKMBINDEX, TROPONINI in the last 168 hours. BNP (last 3 results) No results for input(s): PROBNP in the last 8760 hours. HbA1C: No results for input(s): HGBA1C in the last 72 hours. CBG: No results for input(s): GLUCAP in the last 168 hours. Lipid Profile: No results for input(s): CHOL, HDL, LDLCALC, TRIG, CHOLHDL, LDLDIRECT in the last 72 hours. Thyroid Function Tests: No results for input(s): TSH, T4TOTAL, FREET4, T3FREE, THYROIDAB in the last 72 hours. Anemia Panel: No results for input(s): VITAMINB12, FOLATE, FERRITIN, TIBC, IRON, RETICCTPCT in the last 72 hours. Urine analysis:    Component Value Date/Time   COLORURINE YELLOW 03/31/2018 1720   APPEARANCEUR CLEAR 03/31/2018 1720   LABSPEC >1.046 (H) 03/31/2018 1720   PHURINE 6.0 03/31/2018 1720   GLUCOSEU NEGATIVE 03/31/2018 1720   HGBUR NEGATIVE 03/31/2018 1720   BILIRUBINUR NEGATIVE 03/31/2018 1720   KETONESUR 20 (A) 03/31/2018 1720   PROTEINUR NEGATIVE 03/31/2018 1720   UROBILINOGEN 0.2 02/12/2013 1925   NITRITE NEGATIVE 03/31/2018 1720   LEUKOCYTESUR NEGATIVE 03/31/2018 1720    Radiological Exams on Admission: Ct Abdomen Pelvis W  Contrast  Result Date: 03/31/2018 CLINICAL DATA:  Right upper quadrant pain EXAM: CT ABDOMEN AND PELVIS WITH CONTRAST TECHNIQUE: Multidetector CT imaging of the abdomen and pelvis was performed using the standard protocol following bolus administration of intravenous contrast. CONTRAST:  ISOVUE-300 IOPAMIDOL (ISOVUE-300) INJECTION 61% COMPARISON:  CT chest 07/23/2013 FINDINGS: Lower chest: Patchy airspace disease in the posterior basilar right lower lobe. Hepatobiliary: Unremarkable Pancreas: Unremarkable Spleen: Unremarkable Adrenals/Urinary Tract: 3.9 cm  complex cyst in the upper pole of the right kidney. It is stable in size, but there are now thick enhancing septations. This was not clearly appreciated on the prior study. Adrenal glands and left kidney are within normal limits. Bladder is decompressed Stomach/Bowel: Appendix is within normal limits. No obvious mass in the colon. No evidence of small-bowel obstruction. Stomach is decompressed. Vascular/Lymphatic: No abnormal retroperitoneal adenopathy. Reproductive: Uterus and adnexa are within normal limits. Other: Small amount of free fluid in the dependent pelvis. Musculoskeletal: No vertebral compression deformity. IMPRESSION: Complex cyst in the upper pole of the right kidney now contains enhancing thick septations. Differential diagnosis includes malignancy, benign complex cyst, or possibly an infected cyst. Electronically Signed   By: Jolaine Click M.D.   On: 03/31/2018 16:42    UJW:[id number]    Assessment/Plan Principal Problem:   Sepsis (HCC) Active Problems:   PNA (pneumonia)   Renal lesion   Breast feeding status of mother  Mild AKI  -Admit tele bed. Iv abx per protocol,supporitve care.  Check pneumococcal and Legionella antigen Urine benign here, doubt one dose po ABx would clear it that fast if indeed infected. If no improvement, consider IR for drainage of cyst. ED consulted urology who didn't think emergent intervention was  needed.  She will need urology f/u upon d/c  -Gentle IVF< crt 1.05.      DVT prophylaxis: SCD Code Status: Full  Disposition Plan:  Home 2 days   Consults called: ED spoke with Urology  Dr Retta Diones Admission status: IP tele   I certify that this patient will need inpatient services at least 2 midnights.  Including following of blood and  urine cultures, as well as urinary antigens.` Will be admitted to telemetry to monitor heart rate as she is tachycardic on admission.  Continue with IV hydration  And IV abx.   Synetta Fail MD [facility] Pager [phone]  If 7PM-7AM, please contact night-coverage [email or web address] Password [facility]  03/31/2018, 7:34 PM

## 2018-03-31 NOTE — ED Provider Notes (Addendum)
Emergency Department Provider Note   I have reviewed the triage vital signs and the nursing notes.   HISTORY  Chief Complaint Abdominal Pain   HPI Elizabeth Horton is a 32 y.o. female who presents to the emergency department secondary to progressively worsening abdominal pain.  Patient states that she had a few days of burping and epigastric abdominal pain radiating to her right upper and right lower quadrant along with some back pain.  She went to urgent care yesterday was diagnosed with a urinary tract infection started on antibiotics however she vomited up and does not feel like any of them really got absorbed.  She had persistent symptoms today and started feeling weaker and worse so came here for further evaluation.  Has nausea but no vomiting after yesterday.  No diarrhea or constipation.  No urinary symptoms.  No rashes anywhere.  No cough.  No headache or vision changes. No other associated or modifying symptoms.    Past Medical History:  Diagnosis Date  . Abnormal pap   . Abnormal Pap smear    LEEP 2011  . No pertinent past medical history   . Pneumonia    2013    Patient Active Problem List   Diagnosis Date Noted  . S/P cesarean section 07/15/2013  . H/O shoulder dystocia in prior pregnancy, currently pregnant 12/20/2012    Past Surgical History:  Procedure Laterality Date  . CESAREAN SECTION N/A 07/12/2013   Procedure: CESAREAN SECTION;  Surgeon: Tereso Newcomer, MD;  Location: [facility];  Service: Obstetrics;  Laterality: N/A;  . LEEP    . MOUTH SURGERY  2014   wisdom teeth removed      Allergies Patient has no known allergies.  Family History  Problem Relation Age of Onset  . Colon cancer Father        age 48 diagnosed, succumbed at age 75  . Colon cancer Sister        Three tumors found at time of colonoscopy. age 56.  . Lung cancer Paternal Grandfather        22s  . Breast cancer Paternal Grandmother        51s  . Breast cancer Paternal  Aunt   . Hypertension Mother   . Thyroid disease Mother   . Breast cancer Other        aunt  . Diabetes Maternal Grandmother   . Diabetes Maternal Grandfather     Social History Social History   Tobacco Use  . Smoking status: Never Smoker  . Smokeless tobacco: Never Used  Substance Use Topics  . Alcohol use: No  . Drug use: No    Review of Systems  All other systems negative except as documented in the HPI. All pertinent positives and negatives as reviewed in the HPI. ____________________________________________   PHYSICAL EXAM:  VITAL SIGNS: ED Triage Vitals  Enc Vitals Group     BP 03/31/18 1412 103/83     Pulse Rate 03/31/18 1412 (!) 118     Resp 03/31/18 1412 18     Temp 03/31/18 1412 (!) 100.7 F (38.2 C)     Temp Source 03/31/18 1412 Oral     SpO2 03/31/18 1412 100 %     Weight 03/31/18 1412 196 lb (88.9 kg)     Height 03/31/18 1412 5\' 3"  (1.6 m)    Constitutional: Alert and oriented. Well appearing and in no acute distress. Eyes: Conjunctivae are normal. PERRL. EOMI. Head: Atraumatic. Nose: No congestion/rhinnorhea. Mouth/Throat:  Mucous membranes are dry.  Oropharynx non-erythematous. Neck: No stridor.  No meningeal signs. Cardiovascular: Tachycardic rate, regular rhythm. Good peripheral circulation. Grossly normal heart sounds.   Respiratory: Normal respiratory effort.  No retractions. Lungs CTAB. Gastrointestinal: Soft and mild right upper quadrant tenderness to palpation. No distention.  Musculoskeletal: No lower extremity tenderness nor edema. No gross deformities of extremities.  Right CVA tenderness Neurologic:  Normal speech and language. No gross focal neurologic deficits are appreciated.  Skin:  Skin is warm, dry and intact. No rash noted.  ____________________________________________   LABS (all labs ordered are listed, but only abnormal results are displayed)  Labs Reviewed  COMPREHENSIVE METABOLIC PANEL - Abnormal; Notable for the  following components:      Result Value   Sodium 132 (*)    Potassium 3.4 (*)    CO2 19 (*)    Glucose, Bld 105 (*)    Creatinine, Ser 1.05 (*)    Calcium 8.5 (*)    Total Protein 8.3 (*)    Total Bilirubin 1.7 (*)    All other components within normal limits  CBC - Abnormal; Notable for the following components:   WBC 22.4 (*)    Hemoglobin 11.7 (*)    HCT 35.6 (*)    All other components within normal limits  URINALYSIS, ROUTINE W REFLEX MICROSCOPIC - Abnormal; Notable for the following components:   Specific Gravity, Urine >1.046 (*)    Ketones, ur 20 (*)    All other components within normal limits  CULTURE, BLOOD (ROUTINE X 2)  CULTURE, BLOOD (ROUTINE X 2)  URINE CULTURE  LIPASE, BLOOD  HCG, QUANTITATIVE, PREGNANCY  LACTIC ACID, PLASMA  LACTIC ACID, PLASMA   ____________________________________________  RADIOLOGY  Ct Abdomen Pelvis W Contrast  Result Date: 03/31/2018 CLINICAL DATA:  Right upper quadrant pain EXAM: CT ABDOMEN AND PELVIS WITH CONTRAST TECHNIQUE: Multidetector CT imaging of the abdomen and pelvis was performed using the standard protocol following bolus administration of intravenous contrast. CONTRAST:  ISOVUE-300 IOPAMIDOL (ISOVUE-300) INJECTION 61% COMPARISON:  CT chest 07/23/2013 FINDINGS: Lower chest: Patchy airspace disease in the posterior basilar right lower lobe. Hepatobiliary: Unremarkable Pancreas: Unremarkable Spleen: Unremarkable Adrenals/Urinary Tract: 3.9 cm complex cyst in the upper pole of the right kidney. It is stable in size, but there are now thick enhancing septations. This was not clearly appreciated on the prior study. Adrenal glands and left kidney are within normal limits. Bladder is decompressed Stomach/Bowel: Appendix is within normal limits. No obvious mass in the colon. No evidence of small-bowel obstruction. Stomach is decompressed. Vascular/Lymphatic: No abnormal retroperitoneal adenopathy. Reproductive: Uterus and adnexa are  within normal limits. Other: Small amount of free fluid in the dependent pelvis. Musculoskeletal: No vertebral compression deformity. IMPRESSION: Complex cyst in the upper pole of the right kidney now contains enhancing thick septations. Differential diagnosis includes malignancy, benign complex cyst, or possibly an infected cyst. Electronically Signed   By: Jolaine Click M.D.   On: 03/31/2018 16:42    ____________________________________________   PROCEDURES  Procedure(s) performed:   Procedures  CRITICAL CARE Performed by: Marily Memos Total critical care time: 35 minutes Critical care time was exclusive of separately billable procedures and treating other patients. Critical care was necessary to treat or prevent imminent or life-threatening deterioration. Critical care was time spent personally by me on the following activities: development of treatment plan with patient and/or surrogate as well as nursing, discussions with consultants, evaluation of patient's response to treatment, examination of patient, obtaining history from  patient or surrogate, ordering and performing treatments and interventions, ordering and review of laboratory studies, ordering and review of radiographic studies, pulse oximetry and re-evaluation of patient's condition.  ____________________________________________   INITIAL IMPRESSION / ASSESSMENT AND PLAN / ED COURSE   the patient likely has pyelonephritis however with the pain being in the right upper quadrant right back also consider cholecystitis or abnormal appendicitis will get CT scan.  Rocephin, fluids, fentanyl and Zofran for symptoms.  CT scan shows evidence of a septated cyst on her kidney which with her urinary tract infection yesterday could be causing her symptoms so I discussed with Dr. Retta Diones with urology who put in a short note who recommended conservative treatment and if not improving then IR aspiration at that time approximate 48 hours.   However it also shows a possible right lower lobe pneumonia which could be related to her symptoms.  She states she had pneumonia in the past without any symptoms however she has had no cough or shortness of breath or any other signs of pneumonia.  Secondary this will add on vancomycin to cover for abnormal flora for the abscess and cover pneumonia. Discussed with medicine, who will admit.  Pertinent labs & imaging results that were available during my care of the patient were reviewed by me and considered in my medical decision making (see chart for details).  ____________________________________________  FINAL CLINICAL IMPRESSION(S) / ED DIAGNOSES  Final diagnoses:  Sepsis, due to unspecified organism [facility])  Community acquired pneumonia of right lower lobe of lung (HCC)  Renal cyst     MEDICATIONS GIVEN DURING THIS VISIT:  Medications  vancomycin (VANCOCIN) 1,750 mg in sodium chloride 0.9 % 500 mL IVPB (has no administration in time range)  lactated ringers bolus 1,000 mL (0 mLs Intravenous Stopped 03/31/18 1757)  fentaNYL (SUBLIMAZE) injection 50 mcg (50 mcg Intravenous Given 03/31/18 1533)  ondansetron (ZOFRAN) injection 4 mg (4 mg Intravenous Given 03/31/18 1533)  ketorolac (TORADOL) 30 MG/ML injection 30 mg (30 mg Intravenous Given 03/31/18 1533)  lactated ringers bolus 1,000 mL (0 mLs Intravenous Stopped 03/31/18 1645)  cefTRIAXone (ROCEPHIN) 1 g in sodium chloride 0.9 % 100 mL IVPB (0 g Intravenous Stopped 03/31/18 1629)  iopamidol (ISOVUE-300) 61 % injection 100 mL (100 mLs Intravenous Contrast Given 03/31/18 1614)     NEW OUTPATIENT MEDICATIONS STARTED DURING THIS VISIT:  New Prescriptions   No medications on file    Note:  This note was prepared with assistance of Dragon voice recognition software. Occasional wrong-word or sound-a-like substitutions may have occurred due to the inherent limitations of voice recognition software.   Marily Memos, MD 03/31/18  Zollie Pee   Marily Memos, MD 03/31/18 Zollie Pee

## 2018-03-31 NOTE — ED Notes (Signed)
Patient could not provide enough urine sample for specimen.  She stated she would try again later.

## 2018-03-31 NOTE — ED Notes (Signed)
Attempted to give report to 300 RN- no answer.

## 2018-03-31 NOTE — Consult Note (Signed)
I was called by emergency room physician about this 32 year old with possible infected right kidney.  She does have a cyst that was seen on chest CT in January 2015.  She now has fever and an elevated white count.  Renal function is normal.  At this point, I do not think urologic investigation/management as necessary.  I would recommend hospitalization with intravenous antibiotic management.  The patient does not clinically improve within 48 hours, consider interventional radiology management with percutaneous drainage.  Please contact us if further help needed.

## 2018-03-31 NOTE — ED Notes (Signed)
Report given to [facility]

## 2018-03-31 NOTE — ED Triage Notes (Signed)
Pt reports went to urgent care and diagnosed with UTI. Pt reports epigastric pain radiating around to right upper quad. Pt states she hasn't been able to keep antibiotics down.

## 2018-04-01 LAB — CBC WITH DIFFERENTIAL/PLATELET
Basophils Absolute: 0 10*3/uL (ref 0.0–0.1)
Basophils Relative: 0 %
Eosinophils Absolute: 0 10*3/uL (ref 0.0–0.7)
Eosinophils Relative: 0 %
HCT: 31.9 % — ABNORMAL LOW (ref 36.0–46.0)
Hemoglobin: 10.2 g/dL — ABNORMAL LOW (ref 12.0–15.0)
Lymphocytes Relative: 6 %
Lymphs Abs: 0.9 10*3/uL (ref 0.7–4.0)
MCH: 27.1 pg (ref 26.0–34.0)
MCHC: 32 g/dL (ref 30.0–36.0)
MCV: 84.8 fL (ref 78.0–100.0)
Monocytes Absolute: 1.2 10*3/uL — ABNORMAL HIGH (ref 0.1–1.0)
Monocytes Relative: 8 %
Neutro Abs: 12.7 10*3/uL — ABNORMAL HIGH (ref 1.7–7.7)
Neutrophils Relative %: 86 %
Platelets: 256 10*3/uL (ref 150–400)
RBC: 3.76 MIL/uL — ABNORMAL LOW (ref 3.87–5.11)
RDW: 14.4 % (ref 11.5–15.5)
WBC: 14.9 10*3/uL — ABNORMAL HIGH (ref 4.0–10.5)

## 2018-04-01 LAB — BASIC METABOLIC PANEL
Anion gap: 6 (ref 5–15)
BUN: 7 mg/dL (ref 6–20)
CO2: 23 mmol/L (ref 22–32)
Calcium: 8.2 mg/dL — ABNORMAL LOW (ref 8.9–10.3)
Chloride: 108 mmol/L (ref 98–111)
Creatinine, Ser: 0.92 mg/dL (ref 0.44–1.00)
GFR calc Af Amer: >60 mL/min (ref >60)
GFR calc non Af Amer: >60 mL/min (ref >60)
Glucose, Bld: 129 mg/dL — ABNORMAL HIGH (ref 70–99)
Potassium: 3.5 mmol/L (ref 3.5–5.1)
Sodium: 137 mmol/L (ref 135–145)

## 2018-04-01 LAB — D-DIMER, QUANTITATIVE: D-Dimer, Quant: 2.14 ug/mL-FEU — ABNORMAL HIGH (ref 0.00–0.50)

## 2018-04-01 LAB — STREP PNEUMONIAE URINARY ANTIGEN: Strep Pneumo Urinary Antigen: NEGATIVE

## 2018-04-01 MED ORDER — SODIUM CHLORIDE 0.9 % IV SOLN
INTRAVENOUS | Status: DC
  Administered 2018-04-01 – 2018-04-04 (×8): via INTRAVENOUS

## 2018-04-01 MED ORDER — ENOXAPARIN SODIUM 100 MG/ML ~~LOC~~ SOLN
90.0000 mg | Freq: Two times a day (BID) | SUBCUTANEOUS | Status: DC
  Administered 2018-04-01 – 2018-04-02 (×3): 90 mg via SUBCUTANEOUS
  Filled 2018-04-01 (×3): qty 1

## 2018-04-01 MED ORDER — GUAIFENESIN ER 600 MG PO TB12
600.0000 mg | ORAL_TABLET | Freq: Two times a day (BID) | ORAL | Status: DC
  Administered 2018-04-01 – 2018-04-03 (×5): 600 mg via ORAL
  Filled 2018-04-01 (×6): qty 1

## 2018-04-01 MED ORDER — ALBUTEROL SULFATE (2.5 MG/3ML) 0.083% IN NEBU: 2.5000 mg | INHALATION_SOLUTION | RESPIRATORY_TRACT | Status: DC | PRN

## 2018-04-01 NOTE — Progress Notes (Signed)
Elizabeth Horton Demographics:    Elizabeth Horton, is a 32 y.o. female, DOB - 03-20-1986, ZOX:[id number]  Admit date - 03/31/2018   Admitting Physician Synetta Fail, MD  Outpatient Primary MD for the Elizabeth Horton is Elizabeth Horton, No Pcp Per  LOS - 1   Chief Complaint  Elizabeth Horton presents with  . Abdominal Pain        Subjective:    [name] today has no emesis, intermittent subcostal inframammary and retrosternal area chest discomfort,, somewhat pleuritic, no productive cough, fever curve is trending down  Assessment  & Plan :    Principal Problem:   Sepsis (HCC) Active Problems:   PNA (pneumonia)   Renal lesion   Breast feeding status of mother   AKI (acute kidney injury) (HCC)   CT abd/Pelvis from 03/31/18 IMPRESSION: 1)Patchy airspace disease in the posterior basilar right lower lobe. 2)Complex cyst in the upper pole of the right kidney now contains enhancing thick septations. Differential diagnosis includes malignancy, benign complex cyst, or possibly an infected cyst.     Brief Summary 32 year old female who is 11 months postpartum currently breast-feeding presented with subcostal/inframammary and retrosternal area chest discomfort, admitted on 03/31/18 with CT abdomen findings suggestive of possible right lower lobe pneumonia and possible infected cyst of the right kidney.  Post admission d-dimer elevated plan to do CTA chest on 04/02/2018 after hydration as Elizabeth Horton had a contrast study on 03/31/2018.  Elizabeth Horton apparently was treated with Bactrim for 1 day on 03/30/18 due to concerns about possible UTI when she presented to urgent care with subcostal/flank pain  Plan:- 1) possible infected complex cyst in the upper pole of the right kidney--- ED provider discussed this case with urologist who advised outpatient follow-up, UA without evidence of infection, fever curve is trending down,  continue IV Rocephin, follow-up as outpatient with urologist  2)Atypical chest pains----Elizabeth Horton's chest discomfort is somewhat pleuritic, d-dimer elevated, treat empirically with therapeutic Lovenox pending CTA chest for PE rule out,,  on 03/31/18 with CT abdomen findings suggestive of possible right lower lobe pneumonia and possible infected cyst of the right kidney.  Post admission d-dimer elevated plan to do CTA chest on 04/02/2018 after hydration as Elizabeth Horton had a contrast study on 03/31/2018  3) possible right lower lobe pneumonia----fever curve trending down, no productive cough, treat empirically with Rocephin/azithromycin along with bronchodilators and mucolytics, no hypoxia  Disposition/Need for in-Hospital Stay- Elizabeth Horton unable to be discharged at this time due to fevers in the setting of possible right-sided pneumonia and possible infected complex right kidney cyst, continue IV antibiotics pending further resolution of fevers and clinical improvement.  Given pleuritic symptoms and elevated d-dimer Elizabeth Horton also needs a CTA chest to rule out PE but this cannot be done for now due to contrast study within the last 24 hours Elizabeth Horton needs iVF hydration  with subsequent CTA chest on 04/02/2017  Code Status : full    Disposition Plan  : TBD  Consults  :  n/a   DVT Prophylaxis  :  Lovenox in therapeutic doses pending PE rule out  Lab Results  Component Value Date   PLT 256 04/01/2018    Inpatient Medications  Scheduled Meds: . azithromycin  500 mg Oral Q24H  . guaiFENesin  600  mg Oral BID   Continuous Infusions: . sodium chloride 125 mL/hr at 04/01/18 1023  . cefTRIAXone (ROCEPHIN)  IV     PRN Meds:.acetaminophen, albuterol, alum & mag hydroxide-simeth, HYDROcodone-acetaminophen, ondansetron (ZOFRAN) IV    Anti-infectives (From admission, onward)   Start     Dose/Rate Route Frequency Ordered Stop   04/01/18 1800  cefTRIAXone (ROCEPHIN) 2 g in sodium chloride 0.9 % 100 mL IVPB      2 g 200 mL/hr over 30 Minutes Intravenous Every 24 hours 03/31/18 2006 04/08/18 1759   03/31/18 2100  azithromycin (ZITHROMAX) tablet 500 mg     500 mg Oral Every 24 hours 03/31/18 2006 04/07/18 2059   03/31/18 1900  vancomycin (VANCOCIN) 1,750 mg in sodium chloride 0.9 % 500 mL IVPB  Status:  Discontinued     1,750 mg 250 mL/hr over 120 Minutes Intravenous  Once 03/31/18 1756 03/31/18 1935   03/31/18 1900  cefTRIAXone (ROCEPHIN) 1 g in sodium chloride 0.9 % 100 mL IVPB     1 g 200 mL/hr over 30 Minutes Intravenous  Once 03/31/18 1826 03/31/18 1910   03/31/18 1515  cefTRIAXone (ROCEPHIN) 1 g in sodium chloride 0.9 % 100 mL IVPB     1 g 200 mL/hr over 30 Minutes Intravenous  Once 03/31/18 1515 03/31/18 1629        Objective:   Vitals:   03/31/18 2140 03/31/18 2310 04/01/18 0622 04/01/18 1103  BP: 113/66  106/68   Pulse: 99  99   Resp: 20  (!) 24   Temp: (!) 101.4 F (38.6 C) 99.1 F (37.3 C) (!) 101.3 F (38.5 C) 99.3 F (37.4 C)  TempSrc: Oral Oral Oral Oral  SpO2: 100%  99%   Weight:      Height:        Wt Readings from Last 3 Encounters:  03/31/18 87.9 kg  10/17/16 71.7 kg  09/14/15 71.7 kg     Intake/Output Summary (Last 24 hours) at 04/01/2018 1116 Last data filed at 04/01/2018 0602 Gross per 24 hour  Intake 3337.29 ml  Output 550 ml  Net 2787.29 ml     Physical Exam Elizabeth Horton is examined daily including today on 03/21/18 , exams remain the same as of yesterday except that has changed   Gen:- Awake Alert,  In no apparent distress  HEENT:- [place].AT, No sclera icterus Neck-Supple Neck,No JVD,.  Lungs-diminished in bases right more than left, no wheezing CV- S1, S2 normal, HR 108 Abd-  +ve B.Sounds, Abd Soft, No tenderness,    Extremity/Skin:- No  edema,   warm and dry good pulses Psych-affect is appropriate, oriented x3 Neuro-no new focal deficits, no tremors   Data Review:   Micro Results Recent Results (from the past 240 hour(s))  Blood culture (routine x  2)     Status: None (Preliminary result)   Collection Time: 03/31/18  3:32 PM  Result Value Ref Range Status   Specimen Description BLOOD RIGHT FOREARM  Final   Special Requests   Final    BOTTLES DRAWN AEROBIC AND ANAEROBIC Blood Culture adequate volume   Culture   Final    NO GROWTH < 24 HOURS Performed at [facility], [address] [place], [place] [address]    Report Status PENDING  Incomplete  Blood culture (routine x 2)     Status: None (Preliminary result)   Collection Time: 03/31/18  3:32 PM  Result Value Ref Range Status   Specimen Description BLOOD LEFT ANTECUBITAL  Final  Special Requests   Final    BOTTLES DRAWN AEROBIC AND ANAEROBIC Blood Culture results may not be optimal due to an excessive volume of blood received in culture bottles   Culture   Final    NO GROWTH < 24 HOURS Performed at [facility], [address] [place], [place] [address]    Report Status PENDING  Incomplete    Radiology Reports Ct Abdomen Pelvis W Contrast  Result Date: 03/31/2018 CLINICAL DATA:  Right upper quadrant pain EXAM: CT ABDOMEN AND PELVIS WITH CONTRAST TECHNIQUE: Multidetector CT imaging of the abdomen and pelvis was performed using the standard protocol following bolus administration of intravenous contrast. CONTRAST:  100mL ISOVUE-300 IOPAMIDOL (ISOVUE-300) INJECTION 61% COMPARISON:  CT chest 07/23/2013 FINDINGS: Lower chest: Patchy airspace disease in the posterior basilar right lower lobe. Hepatobiliary: Unremarkable Pancreas: Unremarkable Spleen: Unremarkable Adrenals/Urinary Tract: 3.9 cm complex cyst in the upper pole of the right kidney. It is stable in size, but there are now thick enhancing septations. This was not clearly appreciated on the prior study. Adrenal glands and left kidney are within normal limits. Bladder is decompressed Stomach/Bowel: Appendix is within normal limits. No obvious mass in the colon. No evidence of small-bowel obstruction. Stomach is decompressed.  Vascular/Lymphatic: No abnormal retroperitoneal adenopathy. Reproductive: Uterus and adnexa are within normal limits. Other: Small amount of free fluid in the dependent pelvis. Musculoskeletal: No vertebral compression deformity. IMPRESSION: Complex cyst in the upper pole of the right kidney now contains enhancing thick septations. Differential diagnosis includes malignancy, benign complex cyst, or possibly an infected cyst. Electronically Signed   By: Jolaine ClickArthur  Hoss M.D.   On: 03/31/2018 16:42     CBC Recent Labs  Lab 03/31/18 1459 04/01/18 0452  WBC 22.4* 14.9*  HGB 11.7* 10.2*  HCT 35.6* 31.9*  PLT 294 256  MCV 84.6 84.8  MCH 27.8 27.1  MCHC 32.9 32.0  RDW 14.5 14.4  LYMPHSABS  --  0.9  MONOABS  --  1.2*  EOSABS  --  0.0  BASOSABS  --  0.0    Chemistries  Recent Labs  Lab 03/31/18 1459 04/01/18 0452  NA 132* 137  K 3.4* 3.5  CL 103 108  CO2 19* 23  GLUCOSE 105* 129*  BUN 7 7  CREATININE 1.05* 0.92  CALCIUM 8.5* 8.2*  AST 19  --   ALT 13  --   ALKPHOS 67  --   BILITOT 1.7*  --    ------------------------------------------------------------------------------------------------------------------ No results for input(s): CHOL, HDL, LDLCALC, TRIG, CHOLHDL, LDLDIRECT in the last 72 hours.  No results found for: HGBA1C ------------------------------------------------------------------------------------------------------------------ No results for input(s): TSH, T4TOTAL, T3FREE, THYROIDAB in the last 72 hours.  Invalid input(s): FREET3 ------------------------------------------------------------------------------------------------------------------ No results for input(s): VITAMINB12, FOLATE, FERRITIN, TIBC, IRON, RETICCTPCT in the last 72 hours.  Coagulation profile No results for input(s): INR, PROTIME in the last 168 hours.  Recent Labs    04/01/18 1002  DDIMER 2.14*    Cardiac Enzymes No results for input(s): CKMB, TROPONINI, MYOGLOBIN in the last 168  hours.  Invalid input(s): CK ------------------------------------------------------------------------------------------------------------------ No results found for: BNP   Shon Haleourage Graysin Luczynski M.D on 04/01/2018 at 11:16 AM  Pager---[phone] Go to [email or web address] - password [email or web address] for contact info  [facility] - Office  [phone]

## 2018-04-01 NOTE — Progress Notes (Signed)
ANTICOAGULATION CONSULT NOTE - Initial Consult  Pharmacy Consult for lovenox Indication: pulmonary embolus  No Known Allergies  Patient Measurements: Height: 5\' 3"  (160 cm) Weight: 193 lb 12.6 oz (87.9 kg) IBW/kg (Calculated) : 52.4   Vital Signs: Temp: 99.3 F (37.4 C) (09/12 1103) Temp Source: Oral (09/12 1103) BP: 106/68 (09/12 0622) Pulse Rate: 99 (09/12 0622)  Labs: Recent Labs    03/31/18 1459 04/01/18 0452  HGB 11.7* 10.2*  HCT 35.6* 31.9*  PLT 294 256  CREATININE 1.05* 0.92    Estimated Creatinine Clearance: 92.3 mL/min (by C-G formula based on SCr of 0.92 mg/dL).   Medical History: Past Medical History:  Diagnosis Date  . Abnormal pap   . Abnormal Pap smear    LEEP 2011  . No pertinent past medical history   . Pneumonia    2013    Medications:  No medications prior to admission.    Assessment: Pharmacy consulted to dose lovenox in a patient with elevated d-dimer.  Starting patient on anticoagulation while patient gets CTA chest to rule out PE.   Goal of Therapy:   Monitor platelets by anticoagulation protocol: Yes   Plan:  Lovenox 90 mg subq every 12 hours Monitor labs and s/s of bleeding  Tad MooreSteven C Kyrin Garn 04/01/2018,11:43 AM

## 2018-04-02 ENCOUNTER — Encounter (HOSPITAL_COMMUNITY): Payer: Self-pay | Admitting: Radiology

## 2018-04-02 ENCOUNTER — Inpatient Hospital Stay (HOSPITAL_COMMUNITY): Payer: Medicaid Other

## 2018-04-02 DIAGNOSIS — N179 Acute kidney failure, unspecified: Secondary | ICD-10-CM

## 2018-04-02 LAB — URINE CULTURE: Culture: NO GROWTH

## 2018-04-02 LAB — BASIC METABOLIC PANEL
Anion gap: 7 (ref 5–15)
BUN: <5 mg/dL — ABNORMAL LOW (ref 6–20)
CO2: 21 mmol/L — ABNORMAL LOW (ref 22–32)
Calcium: 8 mg/dL — ABNORMAL LOW (ref 8.9–10.3)
Chloride: 110 mmol/L (ref 98–111)
Creatinine, Ser: 0.83 mg/dL (ref 0.44–1.00)
GFR calc Af Amer: >60 mL/min (ref >60)
GFR calc non Af Amer: >60 mL/min (ref >60)
Glucose, Bld: 94 mg/dL (ref 70–99)
Potassium: 3.2 mmol/L — ABNORMAL LOW (ref 3.5–5.1)
Sodium: 138 mmol/L (ref 135–145)

## 2018-04-02 LAB — CBC
HCT: 30.7 % — ABNORMAL LOW (ref 36.0–46.0)
Hemoglobin: 9.6 g/dL — ABNORMAL LOW (ref 12.0–15.0)
MCH: 26.8 pg (ref 26.0–34.0)
MCHC: 31.3 g/dL (ref 30.0–36.0)
MCV: 85.8 fL (ref 78.0–100.0)
Platelets: 246 10*3/uL (ref 150–400)
RBC: 3.58 MIL/uL — ABNORMAL LOW (ref 3.87–5.11)
RDW: 14.3 % (ref 11.5–15.5)
WBC: 6.7 10*3/uL (ref 4.0–10.5)

## 2018-04-02 LAB — HIV ANTIBODY (ROUTINE TESTING W REFLEX): HIV Screen 4th Generation wRfx: NONREACTIVE

## 2018-04-02 MED ORDER — HEPARIN SODIUM (PORCINE) 5000 UNIT/ML IJ SOLN
5000.0000 [IU] | Freq: Three times a day (TID) | INTRAMUSCULAR | Status: DC
  Administered 2018-04-03 (×2): 5000 [IU] via SUBCUTANEOUS
  Filled 2018-04-02 (×4): qty 1

## 2018-04-02 MED ORDER — FERROUS SULFATE 325 (65 FE) MG PO TABS
325.0000 mg | ORAL_TABLET | Freq: Three times a day (TID) | ORAL | Status: DC
  Administered 2018-04-02 – 2018-04-04 (×5): 325 mg via ORAL
  Filled 2018-04-02 (×6): qty 1

## 2018-04-02 MED ORDER — IOPAMIDOL (ISOVUE-370) INJECTION 76%
100.0000 mL | Freq: Once | INTRAVENOUS | Status: AC | PRN
  Administered 2018-04-02: 100 mL via INTRAVENOUS

## 2018-04-02 MED ORDER — POTASSIUM CHLORIDE CRYS ER 20 MEQ PO TBCR
40.0000 meq | EXTENDED_RELEASE_TABLET | Freq: Once | ORAL | Status: AC
  Administered 2018-04-02: 40 meq via ORAL
  Filled 2018-04-02: qty 2

## 2018-04-02 NOTE — Progress Notes (Signed)
Patient Demographics:    Elizabeth Horton, is a 32 y.o. female, DOB - 02-05-1986, ZOX:[id number]  Admit date - 03/31/2018   Admitting Physician Synetta Fail, MD  Outpatient Primary MD for the patient is Patient, No Pcp Per  LOS - 2   Chief Complaint  Patient presents with  . Abdominal Pain        Subjective:    Elizabeth Horton today has no emesis, complains of heavier than usual menstrual flow starting 04/02/2018, some dyspnea on exertion and pleuritic chest discomfort, T-max 101.9 around 11 PM on 04/01/2018  Assessment  & Plan :    Principal Problem:   Sepsis (HCC) Active Problems:   PNA (pneumonia)   Renal lesion   Breast feeding status of mother   AKI (acute kidney injury) (HCC)  CTA Chest on 04/02/18 --- w/o Acute PE  CT abd/Pelvis from 03/31/18 IMPRESSION: 1)Patchy airspace disease in the posterior basilar right lower lobe. 2)Complex cyst in the upper pole of the right kidney now contains enhancing thick septations. Differential diagnosis includes malignancy, benign complex cyst, or possibly an infected cyst.    Brief Summary 32 year old female who is 11 months postpartum currently breast-feeding presented with subcostal/inframammary and retrosternal area chest discomfort, admitted on 03/31/18 with CT abdomen findings suggestive of possible right lower lobe pneumonia and possible infected cyst of the right kidney.  Post admission d-dimer elevated,  CTA chest on 04/02/2018  Neg for PE,Patient apparently was treated with Bactrim for 1 day on 03/30/18 due to concerns about possible UTI when she presented to urgent care with subcostal/flank pain  Plan:- 1)Possible infected complex cyst in the upper pole of the Right Kidney--- ED provider discussed this case with urologist Dr Retta Diones who advised outpatient follow-up, UA without evidence of infection, urine culture negative, I called  and discussed this case with Dr. Berniece Salines the on-call urologist on 04/02/2017 due to persistent fevers (T max 101.9 at 11 pm on 04/01/18), Dr. Marlou Porch was able to pull up the imaging studies himself from 03/31/2018 and imaging studies from 04/02/2018, he advised that we will continue IV Rocephin at this time given that patient's pain is improving and white count is down to 6.7 from 22.4 on admission.  Again persistent fevers noted as above , will recall urology if patient fails to improve further ,otherwise outpatient follow-up urology advised  2)Atypical chest pains----patient's chest discomfort is somewhat pleuritic, d-dimer elevated,  CTA chest on 04/02/2018 without acute PE, mild right lower lobe pneumonia noted patient is already on Rocephin and azithromycin, white count is normalized no significant cough at this time  3)Possible Right Lower Lobe Pneumonia----see #2 above, continue iv Rocephin/azithromycin along with bronchodilators and mucolytics, no hypoxia  4)Acute Blood Loss Anemia- hemoglobin is down to 9.6 from 11.7 on admission due to menorrhagia and hemodilution from hydration, give iron supplementation  Disposition/Need for in-Hospital Stay- patient unable to be discharged at this time due to persistent fevers and tachycardia (HR 115) in the setting of possible right-sided pneumonia and possible infected complex right kidney cyst, continue IV antibiotics pending further resolution of fevers and clinical improvement. (T max 101.9 at 11 pm on 04/01/18)   Code Status : full   Disposition Plan  : home  Consults  :  Phone consult with Dr Marlou Porch, Urology  DVT Prophylaxis  :   Heparin 5000 units subcu q8  Lab Results  Component Value Date   PLT 246 04/02/2018    Inpatient Medications  Scheduled Meds: . azithromycin  500 mg Oral Q24H  . ferrous sulfate  325 mg Oral TID WC  . guaiFENesin  600 mg Oral BID  . [START ON 04/03/2018] heparin injection (subcutaneous)  5,000 Units  Subcutaneous Q8H   Continuous Infusions: . sodium chloride 150 mL/hr at 04/02/18 1000  . cefTRIAXone (ROCEPHIN)  IV 2 g (04/01/18 1803)   PRN Meds:.acetaminophen, albuterol, alum & mag hydroxide-simeth, HYDROcodone-acetaminophen, ondansetron (ZOFRAN) IV    Anti-infectives (From admission, onward)   Start     Dose/Rate Route Frequency Ordered Stop   04/01/18 1800  cefTRIAXone (ROCEPHIN) 2 g in sodium chloride 0.9 % 100 mL IVPB     2 g 200 mL/hr over 30 Minutes Intravenous Every 24 hours 03/31/18 2006 04/08/18 1759   03/31/18 2100  azithromycin (ZITHROMAX) tablet 500 mg     500 mg Oral Every 24 hours 03/31/18 2006 04/07/18 2059   03/31/18 1900  vancomycin (VANCOCIN) 1,750 mg in sodium chloride 0.9 % 500 mL IVPB  Status:  Discontinued     1,750 mg 250 mL/hr over 120 Minutes Intravenous  Once 03/31/18 1756 03/31/18 1935   03/31/18 1900  cefTRIAXone (ROCEPHIN) 1 g in sodium chloride 0.9 % 100 mL IVPB     1 g 200 mL/hr over 30 Minutes Intravenous  Once 03/31/18 1826 03/31/18 1910   03/31/18 1515  cefTRIAXone (ROCEPHIN) 1 g in sodium chloride 0.9 % 100 mL IVPB     1 g 200 mL/hr over 30 Minutes Intravenous  Once 03/31/18 1515 03/31/18 1629        Objective:   Vitals:   04/01/18 2250 04/02/18 0200 04/02/18 0542 04/02/18 1308  BP: 116/80  120/73 122/78  Pulse: (!) 115  78 75  Resp: 20  20 16   Temp: (!) 101.9 F (38.8 C) 99.5 F (37.5 C) 98.7 F (37.1 C) 99.6 F (37.6 C)  TempSrc: Oral Oral Oral Oral  SpO2: 100%  100% 100%  Weight:      Height:        Wt Readings from Last 3 Encounters:  03/31/18 87.9 kg  10/17/16 71.7 kg  09/14/15 71.7 kg     Intake/Output Summary (Last 24 hours) at 04/02/2018 1344 Last data filed at 04/02/2018 1000 Gross per 24 hour  Intake 3841.65 ml  Output 500 ml  Net 3341.65 ml     Physical Exam Patient is examined daily including today on 03/21/18 , exams remain the same as of yesterday except that has changed   Gen:- Awake Alert,  In no  apparent distress  HEENT:- [place].AT, No sclera icterus Neck-Supple Neck,No JVD,.  Lungs-diminished in bases right more than left, no wheezing CV- S1, S2 normal, HR 108 Abd-  +ve B.Sounds, Abd Soft, No tenderness,    Extremity/Skin:- No  edema,   warm and dry good pulses Psych-affect is appropriate, oriented x3 Neuro-no new focal deficits, no tremors   Data Review:   Micro Results Recent Results (from the past 240 hour(s))  Blood culture (routine x 2)     Status: None (Preliminary result)   Collection Time: 03/31/18  3:32 PM  Result Value Ref Range Status   Specimen Description BLOOD RIGHT FOREARM  Final   Special Requests   Final    BOTTLES  DRAWN AEROBIC AND ANAEROBIC Blood Culture adequate volume   Culture   Final    NO GROWTH 2 DAYS Performed at [facility], [address] [place], [place] [address]    Report Status PENDING  Incomplete  Blood culture (routine x 2)     Status: None (Preliminary result)   Collection Time: 03/31/18  3:32 PM  Result Value Ref Range Status   Specimen Description BLOOD LEFT ANTECUBITAL  Final   Special Requests   Final    BOTTLES DRAWN AEROBIC AND ANAEROBIC Blood Culture results may not be optimal due to an excessive volume of blood received in culture bottles   Culture   Final    NO GROWTH 2 DAYS Performed at [facility], [address] [place], [place] [address]    Report Status PENDING  Incomplete  Urine culture     Status: None   Collection Time: 03/31/18  5:20 PM  Result Value Ref Range Status   Specimen Description   Final    URINE, CLEAN CATCH Performed at [facility], [address] [place], [place] [address]    Special Requests   Final    NONE Performed at [facility], [address] [place], [place] [address]    Culture   Final    NO GROWTH Performed at [facility], [address] [place], [place] [address]    Report Status 04/02/2018 FINAL  Final    Radiology Reports Ct Angio Chest Pe W Or Wo Contrast  Addendum  Date: 04/02/2018   ADDENDUM REPORT: 04/02/2018 13:01 ADDENDUM: Although the cystic lesion in the upper pole of the right kidney has not changed in size since 07/23/2013, this had thick enhancing septations on the recent abdomen CT, which were more suspicious for the possibility of malignancy, as previously described. This could also be an indication of infection of the cyst. Electronically Signed   By: Beckie SaltsSteven  Reid M.D.   On: 04/02/2018 13:01   Result Date: 04/02/2018 CLINICAL DATA:  Chest pain. Elevated D-dimer. EXAM: CT ANGIOGRAPHY CHEST WITH CONTRAST TECHNIQUE: Multidetector CT imaging of the chest was performed using the standard protocol during bolus administration of intravenous contrast. Multiplanar CT image reconstructions and MIPs were obtained to evaluate the vascular anatomy. CONTRAST:  100mL ISOVUE-370 IOPAMIDOL (ISOVUE-370) INJECTION 76% COMPARISON:  07/23/2013 and abdomen and pelvis CT dated 03/31/2018. FINDINGS: Cardiovascular: Satisfactory opacification of the pulmonary arteries to the segmental level. No evidence of pulmonary embolism. Normal heart size. No pericardial effusion. Mediastinum/Nodes: No enlarged mediastinal, hilar, or axillary lymph nodes. Thyroid gland, trachea, and esophagus demonstrate no significant findings. Lungs/Pleura: Small right pleural effusion. Mild right lower lobe atelectasis and minimal patchy density. Upper Abdomen: The previously demonstrated 3.9 cm complex cystic lesion in the upper pole of the right kidney is again demonstrated. This measures 3.7 cm in maximum diameter on image number 84 series 4 13 Hounsfield units in density. Musculoskeletal: Mild levoconvex thoracic scoliosis. Review of the MIP images confirms the above findings. IMPRESSION: 1. No pulmonary emboli. 2. Small right pleural effusion and mild right lower lobe atelectasis and possible minimal pneumonia. 3. Stable complex cystic lesion in the upper pole of the right kidney. The long-term stability is  compatible with a benign process. Electronically Signed: By: Beckie SaltsSteven  Reid M.D. On: 04/02/2018 12:39   Ct Abdomen Pelvis W Contrast  Result Date: 03/31/2018 CLINICAL DATA:  Right upper quadrant pain EXAM: CT ABDOMEN AND PELVIS WITH CONTRAST TECHNIQUE: Multidetector CT imaging of the abdomen and pelvis  was performed using the standard protocol following bolus administration of intravenous contrast. CONTRAST:  ISOVUE-300 IOPAMIDOL (ISOVUE-300) INJECTION 61% COMPARISON:  CT chest 07/23/2013 FINDINGS: Lower chest: Patchy airspace disease in the posterior basilar right lower lobe. Hepatobiliary: Unremarkable Pancreas: Unremarkable Spleen: Unremarkable Adrenals/Urinary Tract: 3.9 cm complex cyst in the upper pole of the right kidney. It is stable in size, but there are now thick enhancing septations. This was not clearly appreciated on the prior study. Adrenal glands and left kidney are within normal limits. Bladder is decompressed Stomach/Bowel: Appendix is within normal limits. No obvious mass in the colon. No evidence of small-bowel obstruction. Stomach is decompressed. Vascular/Lymphatic: No abnormal retroperitoneal adenopathy. Reproductive: Uterus and adnexa are within normal limits. Other: Small amount of free fluid in the dependent pelvis. Musculoskeletal: No vertebral compression deformity. IMPRESSION: Complex cyst in the upper pole of the right kidney now contains enhancing thick septations. Differential diagnosis includes malignancy, benign complex cyst, or possibly an infected cyst. Electronically Signed   By: Jolaine Click M.D.   On: 03/31/2018 16:42     CBC Recent Labs  Lab 03/31/18 1459 04/01/18 0452 04/02/18 0948  WBC 22.4* 14.9* 6.7  HGB 11.7* 10.2* 9.6*  HCT 35.6* 31.9* 30.7*  PLT 294 256 246  MCV 84.6 84.8 85.8  MCH 27.8 27.1 26.8  MCHC 32.9 32.0 31.3  RDW 14.5 14.4 14.3  LYMPHSABS  --  0.9  --   MONOABS  --  1.2*  --   EOSABS  --  0.0  --   BASOSABS  --  0.0  --      Chemistries  Recent Labs  Lab 03/31/18 1459 04/01/18 0452 04/02/18 0948  NA 132* 137 138  K 3.4* 3.5 3.2*  CL 103 108 110  CO2 19* 23 21*  GLUCOSE 105* 129* 94  BUN 7 7 <5*  CREATININE 1.05* 0.92 0.83  CALCIUM 8.5* 8.2* 8.0*  AST 19  --   --   ALT 13  --   --   ALKPHOS 67  --   --   BILITOT 1.7*  --   --    ------------------------------------------------------------------------------------------------------------------ No results for input(s): CHOL, HDL, LDLCALC, TRIG, CHOLHDL, LDLDIRECT in the last 72 hours.  No results found for: HGBA1C ------------------------------------------------------------------------------------------------------------------ No results for input(s): TSH, T4TOTAL, T3FREE, THYROIDAB in the last 72 hours.  Invalid input(s): FREET3 ------------------------------------------------------------------------------------------------------------------ No results for input(s): VITAMINB12, FOLATE, FERRITIN, TIBC, IRON, RETICCTPCT in the last 72 hours.  Coagulation profile No results for input(s): INR, PROTIME in the last 168 hours.  Recent Labs    04/01/18 1002  DDIMER 2.14*    Cardiac Enzymes No results for input(s): CKMB, TROPONINI, MYOGLOBIN in the last 168 hours.  Invalid input(s): CK ------------------------------------------------------------------------------------------------------------------ No results found for: BNP   Shon Hale M.D on 04/02/2018 at 1:44 PM  Pager---[phone] Go to [email or web address] - password [email or web address] for contact info  [facility] - Office  [phone]

## 2018-04-03 LAB — CBC
HCT: 29.4 % — ABNORMAL LOW (ref 36.0–46.0)
Hemoglobin: 9.3 g/dL — ABNORMAL LOW (ref 12.0–15.0)
MCH: 27 pg (ref 26.0–34.0)
MCHC: 31.6 g/dL (ref 30.0–36.0)
MCV: 85.2 fL (ref 78.0–100.0)
Platelets: 265 10*3/uL (ref 150–400)
RBC: 3.45 MIL/uL — ABNORMAL LOW (ref 3.87–5.11)
RDW: 14.3 % (ref 11.5–15.5)
WBC: 7.6 10*3/uL (ref 4.0–10.5)

## 2018-04-03 LAB — LEGIONELLA PNEUMOPHILA SEROGP 1 UR AG: L. pneumophila Serogp 1 Ur Ag: NEGATIVE

## 2018-04-03 LAB — BASIC METABOLIC PANEL
Anion gap: 4 — ABNORMAL LOW (ref 5–15)
BUN: <5 mg/dL — ABNORMAL LOW (ref 6–20)
CO2: 23 mmol/L (ref 22–32)
Calcium: 8.1 mg/dL — ABNORMAL LOW (ref 8.9–10.3)
Chloride: 110 mmol/L (ref 98–111)
Creatinine, Ser: 0.72 mg/dL (ref 0.44–1.00)
GFR calc Af Amer: >60 mL/min (ref >60)
GFR calc non Af Amer: >60 mL/min (ref >60)
Glucose, Bld: 98 mg/dL (ref 70–99)
Potassium: 3.7 mmol/L (ref 3.5–5.1)
Sodium: 137 mmol/L (ref 135–145)

## 2018-04-03 MED ORDER — LACTINEX PO CHEW
2.0000 | CHEWABLE_TABLET | Freq: Three times a day (TID) | ORAL | Status: DC
  Administered 2018-04-03 – 2018-04-04 (×4): 2 via ORAL
  Filled 2018-04-03 (×10): qty 2

## 2018-04-03 NOTE — Progress Notes (Signed)
Patient Demographics:    Elizabeth Horton, is a 32 y.o. female, DOB - 1985/07/22, WUJ:[id number]  Admit date - 03/31/2018   Admitting Physician Synetta Fail, MD  Outpatient Primary MD for the patient is Patient, No Pcp Per  LOS - 3   Chief Complaint  Patient presents with  . Abdominal Pain        Subjective:    Elizabeth Horton today has no emesis, LMP  04/02/2018,   no further fevers, complains of palpitation  Assessment  & Plan :    Principal Problem:   Sepsis (HCC) Active Problems:   PNA (pneumonia)   Renal lesion   Breast feeding status of mother   AKI (acute kidney injury) (HCC)  CTA Chest on 04/02/18 --- w/o Acute PE  CT abd/Pelvis from 03/31/18 IMPRESSION: 1)Patchy airspace disease in the posterior basilar right lower lobe. 2)Complex cyst in the upper pole of the right kidney now contains enhancing thick septations. Differential diagnosis includes malignancy, benign complex cyst, or possibly an infected cyst.    Brief Summary 32 year old female who is 11 months postpartum currently breast-feeding presented with subcostal/inframammary and retrosternal area chest discomfort, admitted on 03/31/18 with CT abdomen findings suggestive of possible right lower lobe pneumonia and possible infected cyst of the right kidney.  Post admission d-dimer elevated,  CTA chest on 04/02/2018  Neg for PE,Patient apparently was treated with Bactrim for 1 day on 03/30/18 due to concerns about possible UTI when she presented to urgent care with subcostal/flank pain  Plan:- 1)Possible infected complex cyst in the upper pole of the Right Kidney--- ED provider discussed this case with urologist Dr Retta Diones who advised outpatient follow-up, UA without evidence of infection, urine culture negative, I called and discussed this case with Dr. Berniece Salines the on-call urologist on 04/02/2017 due to  persistent fevers , Dr. Marlou Porch was able to pull up the imaging studies himself from 03/31/2018 and imaging studies from 04/02/2018, he advised that we will continue IV Rocephin at this time given that patient's pain is improving and white count is down to 7 from 22.4 on admission.  will recall urology if patient fails to improve further ,otherwise outpatient follow-up urology advised, persistent fevers (Tmax 102.2 at 2130 pm last evening), intermittent tachycardia and palpitations   in the setting of possible right-sided pneumonia and possible infected complex right kidney cyst, c   2)Atypical chest pains----patient's chest discomfort is somewhat pleuritic, d-dimer elevated,  CTA chest on 04/02/2018 without acute PE, mild right lower lobe pneumonia noted patient is already on Rocephin and azithromycin, white count is normalized no significant cough at this time  3)Possible Right Lower Lobe Pneumonia----see #2 above, continue iv Rocephin/azithromycin along with bronchodilators and mucolytics, no hypoxia  4)Acute Blood Loss Anemia- hemoglobin is down to 9.3 from 11.7 on admission due to menorrhagia and hemodilution from hydration, c/n  iron supplementation  Disposition/Need for in-Hospital Stay- patient unable to be discharged at this time due to persistent fevers (Tmax 102.2 at 2130 pm last evening), intermittent tachycardia and palpitations   in the setting of possible right-sided pneumonia and possible infected complex right kidney cyst, continue IV antibiotics pending further resolution of fevers and clinical improvement.    Code Status : full  Disposition Plan  : home   Consults  :  Phone consult with Dr Marlou Porch, Urology  DVT Prophylaxis  :   Heparin 5000 units subcu q8  Lab Results  Component Value Date   PLT 265 04/03/2018    Inpatient Medications  Scheduled Meds: . azithromycin  500 mg Oral Q24H  . ferrous sulfate  325 mg Oral TID WC  . guaiFENesin  600 mg Oral BID  . heparin  injection (subcutaneous)  5,000 Units Subcutaneous Q8H  . lactobacillus acidophilus & bulgar  2 tablet Oral TID   Continuous Infusions: . sodium chloride 50 mL/hr at 04/03/18 1000  . cefTRIAXone (ROCEPHIN)  IV Stopped (04/02/18 1808)   PRN Meds:.acetaminophen, albuterol, alum & mag hydroxide-simeth, HYDROcodone-acetaminophen, ondansetron (ZOFRAN) IV    Anti-infectives (From admission, onward)   Start     Dose/Rate Route Frequency Ordered Stop   04/01/18 1800  cefTRIAXone (ROCEPHIN) 2 g in sodium chloride 0.9 % 100 mL IVPB     2 g 200 mL/hr over 30 Minutes Intravenous Every 24 hours 03/31/18 2006 04/08/18 1759   03/31/18 2100  azithromycin (ZITHROMAX) tablet 500 mg     500 mg Oral Every 24 hours 03/31/18 2006 04/07/18 2059   03/31/18 1900  vancomycin (VANCOCIN) 1,750 mg in sodium chloride 0.9 % 500 mL IVPB  Status:  Discontinued     1,750 mg 250 mL/hr over 120 Minutes Intravenous  Once 03/31/18 1756 03/31/18 1935   03/31/18 1900  cefTRIAXone (ROCEPHIN) 1 g in sodium chloride 0.9 % 100 mL IVPB     1 g 200 mL/hr over 30 Minutes Intravenous  Once 03/31/18 1826 03/31/18 1910   03/31/18 1515  cefTRIAXone (ROCEPHIN) 1 g in sodium chloride 0.9 % 100 mL IVPB     1 g 200 mL/hr over 30 Minutes Intravenous  Once 03/31/18 1515 03/31/18 1629        Objective:   Vitals:   04/02/18 2139 04/02/18 2347 04/03/18 0541 04/03/18 1506  BP: 121/81  115/84 (!) 126/92  Pulse: 64  60 70  Resp: 16  16 20   Temp: (!) 102.2 F (39 C) 99.8 F (37.7 C) 99.4 F (37.4 C) 98.3 F (36.8 C)  TempSrc: Oral Oral Oral Oral  SpO2: 100%  100% 100%  Weight:      Height:        Wt Readings from Last 3 Encounters:  03/31/18 87.9 kg  10/17/16 71.7 kg  09/14/15 71.7 kg     Intake/Output Summary (Last 24 hours) at 04/03/2018 1605 Last data filed at 04/03/2018 1000 Gross per 24 hour  Intake 3554.52 ml  Output 200 ml  Net 3354.52 ml     Physical Exam Patient is examined daily including today on 04/03/18 ,  exams remain the same as of yesterday except that has changed   Gen:- Awake Alert,  In no apparent distress  HEENT:- [place].AT, No sclera icterus Neck-Supple Neck,No JVD,.  Lungs-diminished in bases right more than left, no wheezing CV- S1, S2 normal, HR 108 Abd-  +ve B.Sounds, Abd Soft, No tenderness, no CVA tenderness Extremity/Skin:- No  edema,   warm and dry good pulses Psych-affect is appropriate, oriented x3 Neuro-no new focal deficits, no tremors   Data Review:   Micro Results Recent Results (from the past 240 hour(s))  Blood culture (routine x 2)     Status: None (Preliminary result)   Collection Time: 03/31/18  3:32 PM  Result Value Ref Range Status   Specimen Description BLOOD RIGHT  FOREARM  Final   Special Requests   Final    BOTTLES DRAWN AEROBIC AND ANAEROBIC Blood Culture adequate volume   Culture   Final    NO GROWTH 3 DAYS Performed at [facility], [address] [place], [place] [address]    Report Status PENDING  Incomplete  Blood culture (routine x 2)     Status: None (Preliminary result)   Collection Time: 03/31/18  3:32 PM  Result Value Ref Range Status   Specimen Description BLOOD LEFT ANTECUBITAL  Final   Special Requests   Final    BOTTLES DRAWN AEROBIC AND ANAEROBIC Blood Culture results may not be optimal due to an excessive volume of blood received in culture bottles   Culture   Final    NO GROWTH 3 DAYS Performed at [facility], [address] [place], [place] [address]    Report Status PENDING  Incomplete  Urine culture     Status: None   Collection Time: 03/31/18  5:20 PM  Result Value Ref Range Status   Specimen Description   Final    URINE, CLEAN CATCH Performed at [facility], [address], [place], [place] [address]    Special Requests   Final    NONE Performed at [facility], [address], [place], [place] [address]    Culture   Final    NO GROWTH Performed at [facility], [address], [place], [place] [address]     Report Status 04/02/2018 FINAL  Final  Culture, blood (Routine X 2) w Reflex to ID Panel     Status: None (Preliminary result)   Collection Time: 04/03/18  8:49 AM  Result Value Ref Range Status   Specimen Description BLOOD  Final   Special Requests Normal  Final   Culture   Final    NO GROWTH < 12 HOURS Performed at [facility], [address], [place], [place] [address]    Report Status PENDING  Incomplete    Radiology Reports Ct Angio Chest Pe W Or Wo Contrast  Addendum Date: 04/02/2018   ADDENDUM REPORT: 04/02/2018 13:01 ADDENDUM: Although the cystic lesion in the upper pole of the right kidney has not changed in size since 07/23/2013, this had thick enhancing septations on the recent abdomen CT, which were more suspicious for the possibility of malignancy, as previously described. This could also be an indication of infection of the cyst. Electronically Signed   By: Beckie Salts M.D.   On: 04/02/2018 13:01   Result Date: 04/02/2018 CLINICAL DATA:  Chest pain. Elevated D-dimer. EXAM: CT ANGIOGRAPHY CHEST WITH CONTRAST TECHNIQUE: Multidetector CT imaging of the chest was performed using the standard protocol during bolus administration of intravenous contrast. Multiplanar CT image reconstructions and MIPs were obtained to evaluate the vascular anatomy. CONTRAST:  ISOVUE-370 IOPAMIDOL (ISOVUE-370) INJECTION 76% COMPARISON:  07/23/2013 and abdomen and pelvis CT dated 03/31/2018. FINDINGS: Cardiovascular: Satisfactory opacification of the pulmonary arteries to the segmental level. No evidence of pulmonary embolism. Normal heart size. No pericardial effusion. Mediastinum/Nodes: No enlarged mediastinal, hilar, or axillary lymph nodes. Thyroid gland, trachea, and esophagus demonstrate no significant findings. Lungs/Pleura: Small right pleural effusion. Mild right lower lobe atelectasis and minimal patchy density. Upper Abdomen: The previously demonstrated 3.9 cm complex cystic lesion in the  upper pole of the right kidney is again demonstrated. This measures 3.7 cm in maximum diameter on image number 84 series 4 13 Hounsfield units in density. Musculoskeletal: Mild levoconvex thoracic scoliosis. Review  of the MIP images confirms the above findings. IMPRESSION: 1. No pulmonary emboli. 2. Small right pleural effusion and mild right lower lobe atelectasis and possible minimal pneumonia. 3. Stable complex cystic lesion in the upper pole of the right kidney. The long-term stability is compatible with a benign process. Electronically Signed: By: Beckie SaltsSteven  Reid M.D. On: 04/02/2018 12:39   Ct Abdomen Pelvis W Contrast  Result Date: 03/31/2018 CLINICAL DATA:  Right upper quadrant pain EXAM: CT ABDOMEN AND PELVIS WITH CONTRAST TECHNIQUE: Multidetector CT imaging of the abdomen and pelvis was performed using the standard protocol following bolus administration of intravenous contrast. CONTRAST:  100mL ISOVUE-300 IOPAMIDOL (ISOVUE-300) INJECTION 61% COMPARISON:  CT chest 07/23/2013 FINDINGS: Lower chest: Patchy airspace disease in the posterior basilar right lower lobe. Hepatobiliary: Unremarkable Pancreas: Unremarkable Spleen: Unremarkable Adrenals/Urinary Tract: 3.9 cm complex cyst in the upper pole of the right kidney. It is stable in size, but there are now thick enhancing septations. This was not clearly appreciated on the prior study. Adrenal glands and left kidney are within normal limits. Bladder is decompressed Stomach/Bowel: Appendix is within normal limits. No obvious mass in the colon. No evidence of small-bowel obstruction. Stomach is decompressed. Vascular/Lymphatic: No abnormal retroperitoneal adenopathy. Reproductive: Uterus and adnexa are within normal limits. Other: Small amount of free fluid in the dependent pelvis. Musculoskeletal: No vertebral compression deformity. IMPRESSION: Complex cyst in the upper pole of the right kidney now contains enhancing thick septations. Differential diagnosis  includes malignancy, benign complex cyst, or possibly an infected cyst. Electronically Signed   By: Jolaine ClickArthur  Hoss M.D.   On: 03/31/2018 16:42     CBC Recent Labs  Lab 03/31/18 1459 04/01/18 0452 04/02/18 0948 04/03/18 0646  WBC 22.4* 14.9* 6.7 7.6  HGB 11.7* 10.2* 9.6* 9.3*  HCT 35.6* 31.9* 30.7* 29.4*  PLT 294 256 246 265  MCV 84.6 84.8 85.8 85.2  MCH 27.8 27.1 26.8 27.0  MCHC 32.9 32.0 31.3 31.6  RDW 14.5 14.4 14.3 14.3  LYMPHSABS  --  0.9  --   --   MONOABS  --  1.2*  --   --   EOSABS  --  0.0  --   --   BASOSABS  --  0.0  --   --     Chemistries  Recent Labs  Lab 03/31/18 1459 04/01/18 0452 04/02/18 0948 04/03/18 0646  NA 132* 137 138 137  K 3.4* 3.5 3.2* 3.7  CL 103 108 110 110  CO2 19* 23 21* 23  GLUCOSE 105* 129* 94 98  BUN 7 7 <5* <5*  CREATININE 1.05* 0.92 0.83 0.72  CALCIUM 8.5* 8.2* 8.0* 8.1*  AST 19  --   --   --   ALT 13  --   --   --   ALKPHOS 67  --   --   --   BILITOT 1.7*  --   --   --    ------------------------------------------------------------------------------------------------------------------ No results for input(s): CHOL, HDL, LDLCALC, TRIG, CHOLHDL, LDLDIRECT in the last 72 hours.  No results found for: HGBA1C ------------------------------------------------------------------------------------------------------------------ No results for input(s): TSH, T4TOTAL, T3FREE, THYROIDAB in the last 72 hours.  Invalid input(s): FREET3 ------------------------------------------------------------------------------------------------------------------ No results for input(s): VITAMINB12, FOLATE, FERRITIN, TIBC, IRON, RETICCTPCT in the last 72 hours.  Coagulation profile No results for input(s): INR, PROTIME in the last 168 hours.  Recent Labs    04/01/18 1002  DDIMER 2.14*    Cardiac Enzymes No results for input(s): CKMB, TROPONINI, MYOGLOBIN in the last  168 hours.  Invalid input(s):  CK ------------------------------------------------------------------------------------------------------------------ No results found for: BNP   Shon Hale M.D on 04/03/2018 at 4:05 PM  Pager---[phone] Go to [email or web address] - password [email or web address] for contact info  [facility] - Office  [phone]

## 2018-04-03 NOTE — Progress Notes (Signed)
This RN came into patient's room (332 at that time) at 1700 and family members were present and very irate that patient's bathroom was "flooded" stating that this hospital is unsanitary. Explained to patient that we would change her room so that maintenance could address the bathroom issue. Family members also stated that flushing the patient's IV was not done properly, despite explanation that it was done per policy. Patient has no complaints and all issues were addressed. Hospital Supervisor in to speak with patient and family once the patient was moved into a clean room.  Quita SkyeMorgan P Dishmon, RN

## 2018-04-04 MED ORDER — GUAIFENESIN ER 600 MG PO TB12: 600.0000 mg | ORAL_TABLET | Freq: Two times a day (BID) | ORAL | 0 refills | Status: DC

## 2018-04-04 MED ORDER — LACTINEX PO CHEW: 2.0000 | CHEWABLE_TABLET | Freq: Three times a day (TID) | ORAL | 1 refills | Status: DC

## 2018-04-04 MED ORDER — AZITHROMYCIN 500 MG PO TABS: ORAL_TABLET | ORAL | 0 refills | Status: DC

## 2018-04-04 MED ORDER — FERROUS SULFATE 325 (65 FE) MG PO TABS: 325.0000 mg | ORAL_TABLET | Freq: Two times a day (BID) | ORAL | 3 refills | Status: DC

## 2018-04-04 MED ORDER — ACETAMINOPHEN 325 MG PO TABS: 650.0000 mg | ORAL_TABLET | Freq: Four times a day (QID) | ORAL | 0 refills | Status: DC | PRN

## 2018-04-04 MED ORDER — FLUCONAZOLE 150 MG PO TABS
150.0000 mg | ORAL_TABLET | Freq: Once | ORAL | Status: AC
  Administered 2018-04-04: 150 mg via ORAL
  Filled 2018-04-04: qty 1

## 2018-04-04 MED ORDER — CEFDINIR 300 MG PO CAPS: 300.0000 mg | ORAL_CAPSULE | Freq: Two times a day (BID) | ORAL | 0 refills | Status: AC

## 2018-04-04 MED ORDER — HYDROCODONE-ACETAMINOPHEN 5-325 MG PO TABS: 1.0000 | ORAL_TABLET | Freq: Four times a day (QID) | ORAL | 0 refills | Status: DC | PRN

## 2018-04-04 NOTE — Discharge Instructions (Signed)
1) [facility] 2 [facility] for reevaluation 2)Please Call [phone] to make an appointment with the urologist 3) take Omnicef/cefdinir antibiotic as prescribed to complete your treatment for right-sided infected kidney cyst 4) please take azithromycin for additional 3 days to complete the treatment for right-sided pneumonia

## 2018-04-04 NOTE — Discharge Summary (Signed)
Elizabeth Horton, is a 32 y.o. female  DOB 1985-10-24  MRN [id number].  Admission date:  03/31/2018  Admitting Physician  Synetta Fail, MD  Discharge Date:  04/04/2018   Primary MD  Patient, No Pcp Per  Recommendations for primary care physician for things to follow:   1) please follow-up at [facility] urology in 1 to 2 weeks for reevaluation 2)Please Call [phone] to make an appointment with the urologist 3) take Omnicef/cefdinir antibiotic as prescribed to complete your treatment for right-sided infected kidney cyst 4) please take azithromycin for additional 3 days to complete the treatment for right-sided pneumonia   Admission Diagnosis  Renal cyst [N28.1] Sepsis, due to unspecified organism (HCC) [A41.9] Community acquired pneumonia of right lower lobe of lung (HCC) [J18.1]   Discharge Diagnosis  Renal cyst [N28.1] Sepsis, due to unspecified organism (HCC) [A41.9] Community acquired pneumonia of right lower lobe of lung (HCC) [J18.1]    Principal Problem:   Sepsis (HCC) Active Problems:   PNA (pneumonia)   Renal lesion/Infected Rt Kidney cyst   Breast feeding status of mother   AKI (acute kidney injury) [facility])      Past Medical History:  Diagnosis Date  . Abnormal pap   . Abnormal Pap smear    LEEP 2011  . No pertinent past medical history   . Pneumonia    2013    Past Surgical History:  Procedure Laterality Date  . CESAREAN SECTION N/A 07/12/2013   Procedure: CESAREAN SECTION;  Surgeon: Tereso Newcomer, MD;  Location: [facility];  Service: Obstetrics;  Laterality: N/A;  . LEEP    . MOUTH SURGERY  2014   wisdom teeth removed       HPI  from the history and physical done on the day of admission:    Chief Complaint: pleuritic CP, RIGHT FLANK PAIN   HPI: Elizabeth Horton is a 32 y.o. female with no medical history  Presents with pleuritic CP and right flank pain.  She had some dysuria and went to UC on yesterday.  She was Dx with UTi and she took 1 dose of Abx . She cont to have right flank pain and pleuritic chest discomfort and came into ED today.  She had a abdomen and pelvic CT that showed complex cyst in the upper pole of the right kidney now containing enhanced state septations as well as patchy airspace disease in the posterior basilar right lower lobe.  She states that 2 of her children are sick with URI symptoms.  She denies any traveling.  She did get a use air-conditioner recently installed in her home.   Patient gave a birth to a little girl about 11 months ago and she is currently breast-feeding.  ED Course: iv rocephin, 2 liters , iv pain meds      Hospital Course:   CTA Chest on 04/02/18 --- w/o Acute PE  CT abd/Pelvis from 03/31/18 IMPRESSION: 1)Patchy airspace disease in the posterior basilar right lower lobe. 2)Complex cyst in the upper pole  of the right kidney now contains enhancing thick septations. Differential diagnosis includes malignancy, benign complex cyst, or possibly an infected cyst.    Brief Summary 32 year old female who is 11 months postpartum currently breast-feeding presented with subcostal/inframammary and retrosternal area chest discomfort, admitted on 03/31/18 with CT abdomen findings suggestive of possible right lower lobe pneumonia and possible infected cyst of the right kidney.  Post admission d-dimer elevated,  CTA chest on 04/02/2018  Neg for PE,Patient apparently was treated with Bactrim for 1 day on 03/30/18 due to concerns about possible UTI when she presented to urgent care with subcostal/flank pain  Plan:- 1)Possible infected complex cyst in the upper pole of the Right Kidney--- ED provider discussed this case with urologist Dr Retta Diones who advised outpatient follow-up, UA without evidence of infection, urine culture negative, I called and discussed this case with Dr. Berniece Salines the on-call  urologist on 04/02/2017 due to persistent fevers , Dr. Marlou Porch was able to pull up the imaging studies himself from 03/31/2018 and imaging studies from 04/02/2018, he advised that we will continue IV Rocephin at this time given that patient's pain is improving and white count is down to 7 from 22.4 on admission.   outpatient follow-up urology advised, No further fevers since 2130 on 04/02/18, tachycardia and palpitations has resolved  , pt has  possible right-sided pneumonia and possible infected complex right kidney cyst, okay to discharge home on p.o. Omnicef and p.o. Azithromycin, cultures are negative to date  2)Atypical chest pains----patient's chest discomfort is somewhat pleuritic, d-dimer elevated,  CTA chest on 04/02/2018 without acute PE, mild right lower lobe pneumonia noted patient is already on Rocephin and azithromycin, white count is normalized no significant cough at this time  3)Possible Right Lower Lobe Pneumonia----see #2 above, continue iv Rocephin/azithromycin along with bronchodilators and mucolytics, no hypoxia, No further fevers since 2130 on 04/02/18, tachycardia and palpitations has resolved  , okay to discharge home on p.o. Omnicef and p.o. Azithromycin, cultures are negative to date  4)Acute Blood Loss Anemia- hemoglobin is down to 9.3 from 11.7 on admission due to menorrhagia and hemodilution from hydration, c/n  iron supplementation   Code Status : full   Disposition Plan  : home   Consults  :  Phone consult with Dr Marlou Porch, Urology   Discharge Condition: stable, afebrile without flank pain  Follow UP-outpatient follow-up with [facility]   Diet and Activity recommendation:  As advised  Discharge Instructions    Discharge Instructions    Call MD for:  difficulty breathing, headache or visual disturbances   Complete by:  As directed    Call MD for:  hives   Complete by:  As directed    Call MD for:  persistant dizziness or light-headedness   Complete  by:  As directed    Call MD for:  persistant nausea and vomiting   Complete by:  As directed    Call MD for:  severe uncontrolled pain   Complete by:  As directed    Call MD for:  temperature >100.4   Complete by:  As directed    Diet general   Complete by:  As directed    Discharge instructions   Complete by:  As directed    1) please follow-up at [facility] in 1 to 2 weeks for reevaluation 2)Please Call [phone] to make an appointment with the urologist 3) take Omnicef/cefdinir antibiotic as prescribed to complete your treatment for right-sided infected kidney cyst 4) please take azithromycin  for additional 3 days to complete the treatment for right-sided pneumonia   Increase activity slowly   Complete by:  As directed         Discharge Medications     Allergies as of 04/04/2018   No Known Allergies     Medication List    TAKE these medications   acetaminophen 325 MG tablet Commonly known as:  TYLENOL Take 2 tablets (650 mg total) by mouth every 6 (six) hours as needed for mild pain, fever or headache.   azithromycin 500 MG tablet Commonly known as:  ZITHROMAX 1 tablet daily for 3 days only   cefdinir 300 MG capsule Commonly known as:  OMNICEF Take 1 capsule (300 mg total) by mouth 2 (two) times daily for 7 days.   ferrous sulfate 325 (65 FE) MG tablet Take 1 tablet (325 mg total) by mouth 2 (two) times daily with a meal.   guaiFENesin 600 MG 12 hr tablet Commonly known as:  MUCINEX Take 1 tablet (600 mg total) by mouth 2 (two) times daily.   HYDROcodone-acetaminophen 5-325 MG tablet Commonly known as:  NORCO/VICODIN Take 1 tablet by mouth every 6 (six) hours as needed for moderate pain.   lactobacillus acidophilus & bulgar chewable tablet Chew 2 tablets by mouth 3 (three) times daily with meals.       Major procedures and Radiology Reports - PLEASE review detailed and final reports for all details, in brief -   Ct Angio Chest Pe W Or Wo  Contrast  Addendum Date: 04/02/2018   ADDENDUM REPORT: 04/02/2018 13:01 ADDENDUM: Although the cystic lesion in the upper pole of the right kidney has not changed in size since 07/23/2013, this had thick enhancing septations on the recent abdomen CT, which were more suspicious for the possibility of malignancy, as previously described. This could also be an indication of infection of the cyst. Electronically Signed   By: Beckie Salts M.D.   On: 04/02/2018 13:01   Result Date: 04/02/2018 CLINICAL DATA:  Chest pain. Elevated D-dimer. EXAM: CT ANGIOGRAPHY CHEST WITH CONTRAST TECHNIQUE: Multidetector CT imaging of the chest was performed using the standard protocol during bolus administration of intravenous contrast. Multiplanar CT image reconstructions and MIPs were obtained to evaluate the vascular anatomy. CONTRAST:  ISOVUE-370 IOPAMIDOL (ISOVUE-370) INJECTION 76% COMPARISON:  07/23/2013 and abdomen and pelvis CT dated 03/31/2018. FINDINGS: Cardiovascular: Satisfactory opacification of the pulmonary arteries to the segmental level. No evidence of pulmonary embolism. Normal heart size. No pericardial effusion. Mediastinum/Nodes: No enlarged mediastinal, hilar, or axillary lymph nodes. Thyroid gland, trachea, and esophagus demonstrate no significant findings. Lungs/Pleura: Small right pleural effusion. Mild right lower lobe atelectasis and minimal patchy density. Upper Abdomen: The previously demonstrated 3.9 cm complex cystic lesion in the upper pole of the right kidney is again demonstrated. This measures 3.7 cm in maximum diameter on image number 84 series 4 13 Hounsfield units in density. Musculoskeletal: Mild levoconvex thoracic scoliosis. Review of the MIP images confirms the above findings. IMPRESSION: 1. No pulmonary emboli. 2. Small right pleural effusion and mild right lower lobe atelectasis and possible minimal pneumonia. 3. Stable complex cystic lesion in the upper pole of the right kidney. The  long-term stability is compatible with a benign process. Electronically Signed: By: Beckie Salts M.D. On: 04/02/2018 12:39   Ct Abdomen Pelvis W Contrast  Result Date: 03/31/2018 CLINICAL DATA:  Right upper quadrant pain EXAM: CT ABDOMEN AND PELVIS WITH CONTRAST TECHNIQUE: Multidetector CT imaging of the abdomen and  pelvis was performed using the standard protocol following bolus administration of intravenous contrast. CONTRAST:  ISOVUE-300 IOPAMIDOL (ISOVUE-300) INJECTION 61% COMPARISON:  CT chest 07/23/2013 FINDINGS: Lower chest: Patchy airspace disease in the posterior basilar right lower lobe. Hepatobiliary: Unremarkable Pancreas: Unremarkable Spleen: Unremarkable Adrenals/Urinary Tract: 3.9 cm complex cyst in the upper pole of the right kidney. It is stable in size, but there are now thick enhancing septations. This was not clearly appreciated on the prior study. Adrenal glands and left kidney are within normal limits. Bladder is decompressed Stomach/Bowel: Appendix is within normal limits. No obvious mass in the colon. No evidence of small-bowel obstruction. Stomach is decompressed. Vascular/Lymphatic: No abnormal retroperitoneal adenopathy. Reproductive: Uterus and adnexa are within normal limits. Other: Small amount of free fluid in the dependent pelvis. Musculoskeletal: No vertebral compression deformity. IMPRESSION: Complex cyst in the upper pole of the right kidney now contains enhancing thick septations. Differential diagnosis includes malignancy, benign complex cyst, or possibly an infected cyst. Electronically Signed   By: Jolaine Click M.D.   On: 03/31/2018 16:42    Micro Results   Recent Results (from the past 240 hour(s))  Blood culture (routine x 2)     Status: None (Preliminary result)   Collection Time: 03/31/18  3:32 PM  Result Value Ref Range Status   Specimen Description BLOOD RIGHT FOREARM  Final   Special Requests   Final    BOTTLES DRAWN AEROBIC AND ANAEROBIC Blood  Culture adequate volume   Culture   Final    NO GROWTH 4 DAYS Performed at [facility], [address] [place], [place] [address]    Report Status PENDING  Incomplete  Blood culture (routine x 2)     Status: None (Preliminary result)   Collection Time: 03/31/18  3:32 PM  Result Value Ref Range Status   Specimen Description BLOOD LEFT ANTECUBITAL  Final   Special Requests   Final    BOTTLES DRAWN AEROBIC AND ANAEROBIC Blood Culture results may not be optimal due to an excessive volume of blood received in culture bottles   Culture   Final    NO GROWTH 4 DAYS Performed at [facility], [address] [place], [place] [address]    Report Status PENDING  Incomplete  Urine culture     Status: None   Collection Time: 03/31/18  5:20 PM  Result Value Ref Range Status   Specimen Description   Final    URINE, CLEAN CATCH Performed at [facility], [address] [place], [place] [address]    Special Requests   Final    NONE Performed at [facility], [address] [place], [place] [address]    Culture   Final    NO GROWTH Performed at [facility], [address], [place], [place] [address]    Report Status 04/02/2018 FINAL  Final  Culture, blood (Routine X 2) w Reflex to ID Panel     Status: None (Preliminary result)   Collection Time: 04/03/18  8:49 AM  Result Value Ref Range Status   Specimen Description   Final    BLOOD RIGHT ANTECUBITAL BOTTLES DRAWN AEROBIC AND ANAEROBIC   Special Requests Blood Culture adequate volume  Final   Culture   Final    NO GROWTH < 24 HOURS Performed at [facility], [address], [place], [place] [address]    Report Status PENDING  Incomplete   Today   Subjective    Elizabeth Horton today has no  new concerns , No further fevers since 2130 on 04/02/18, tachycardia and palpitations has resolved    Patient has been seen and examined prior to discharge   Objective   Blood pressure (!) 138/95, pulse (!) 59, temperature 98.4 F (36.9  C), temperature source Oral, resp. rate 18, height 5\' 3"  (1.6 m), weight 87.9 kg, last menstrual period 02/18/2018, SpO2 99 %, currently breastfeeding.   Intake/Output Summary (Last 24 hours) at 04/04/2018 1416 Last data filed at 04/04/2018 0800 Gross per 24 hour  Intake 1359.28 ml  Output -  Net 1359.28 ml    Exam Patient is examined daily including today on 04/04/18 , exams remain the same as of yesterday except that has changed   Gen:- Awake Alert,  In no apparent distress  HEENT:- [place].AT, No sclera icterus Neck-Supple Neck,No JVD,.  Lungs-improved air movement no rales no rhonchi CV- S1, S2 normal, HR 108 Abd-  +ve B.Sounds, Abd Soft, No tenderness, no CVA tenderness Extremity/Skin:- No  edema,   warm and dry good pulses Psych-affect is appropriate, oriented x3 Neuro-no new focal deficits, no tremors   Data Review   CBC w Diff:  Lab Results  Component Value Date   WBC 7.6 04/03/2018   HGB 9.3 (L) 04/03/2018   HCT 29.4 (L) 04/03/2018   PLT 265 04/03/2018   LYMPHOPCT 6 04/01/2018   MONOPCT 8 04/01/2018   EOSPCT 0 04/01/2018   BASOPCT 0 04/01/2018    CMP:  Lab Results  Component Value Date   NA 137 04/03/2018   K 3.7 04/03/2018   CL 110 04/03/2018   CO2 23 04/03/2018   BUN <5 (L) 04/03/2018   CREATININE 0.72 04/03/2018   PROT 8.3 (H) 03/31/2018   ALBUMIN 3.9 03/31/2018   BILITOT 1.7 (H) 03/31/2018   ALKPHOS 67 03/31/2018   AST 19 03/31/2018   ALT 13 03/31/2018    Total Discharge time is about 33 minutes  Shon Haleourage Rolene Andrades M.D on 04/04/2018 at 2:16 PM  Pager---[phone]  Go to [email or web address] - password [email or web address] for contact info  [facility] - Office  [phone]

## 2018-04-05 LAB — CULTURE, BLOOD (ROUTINE X 2)
Culture: NO GROWTH
Culture: NO GROWTH
Special Requests: ADEQUATE

## 2018-04-08 LAB — CULTURE, BLOOD (ROUTINE X 2)
Culture: NO GROWTH
Special Requests: ADEQUATE

## 2021-07-30 ENCOUNTER — Emergency Department
Admission: EM | Admit: 2021-07-30 | Discharge: 2021-07-30 | Disposition: A | Discharge status: Home / Self Care | Payer: Medicaid Other | Attending: Emergency Medicine | Admitting: Emergency Medicine

## 2021-07-30 ENCOUNTER — Other Ambulatory Visit: Payer: Self-pay

## 2021-07-30 ENCOUNTER — Emergency Department: Payer: Medicaid Other

## 2021-07-30 DIAGNOSIS — W268XXA Contact with other sharp object(s), not elsewhere classified, initial encounter: Secondary | ICD-10-CM | POA: Insufficient documentation

## 2021-07-30 DIAGNOSIS — S6991XA Unspecified injury of right wrist, hand and finger(s), initial encounter: Secondary | ICD-10-CM | POA: Diagnosis not present

## 2021-07-30 MED ORDER — HYDROCODONE-ACETAMINOPHEN 5-325 MG PO TABS: 1.0000 | ORAL_TABLET | Freq: Four times a day (QID) | ORAL | 0 refills | Status: AC | PRN

## 2021-07-30 NOTE — ED Provider Notes (Signed)
[facility] Provider Note    Event Date/Time   First MD Initiated Contact with Patient 07/30/21 1941     (approximate)   History   Chief Complaint Hand Injury   HPI  Elizabeth Horton is a 36 y.o. female, unremarkable medical history, presents the emergency department for evaluation of head injury.  Patient states that she injured her right hand while trying to karate chop a wooden board yesterday.  She says the board was too thick and she was unable to break it.  Since then she has been experiencing significant pain on the lateral aspect of her right hand.  Reports being unable to clench her hand fully into a fist.  Denies fever/chills, wrist pain, forearm pain, elbow pain, upper arm/shoulder pain, chest pain, shortness of breath, headache, or abdominal pain.   History Limitations: No limitations.      Physical Exam  Triage Vital Signs: ED Triage Vitals  Enc Vitals Group     BP 07/30/21 1851 (!) 133/94     Pulse Rate 07/30/21 1851 72     Resp 07/30/21 1851 16     Temp 07/30/21 1851 98.3 F (36.8 C)     Temp Source 07/30/21 1851 Oral     SpO2 07/30/21 1851 98 %     Weight 07/30/21 1853 165 lb (74.8 kg)     Height 07/30/21 1853 5\' 3"  (1.6 m)     Head Circumference --      Peak Flow --      Pain Score 07/30/21 1856 5     Pain Loc --      Pain Edu? --      Excl. in [place]? --     Most recent vital signs: Vitals:   07/30/21 1851 07/30/21 2019  BP: (!) 133/94 127/67  Pulse: 72 73  Resp: 16 20  Temp: 98.3 F (36.8 C)   SpO2: 98% 100%     Physical Exam Constitutional:      General: She is not in acute distress.    Appearance: Normal appearance. She is not ill-appearing.  Pulmonary:     Effort: Pulmonary effort is normal.  Abdominal:     General: Abdomen is flat.     Palpations: Abdomen is soft.     Tenderness: There is no abdominal tenderness.  Musculoskeletal:     Comments: No gross deformities.  Diffuse swelling of the right hand,  particularly on the ulnar aspect surrounding the fifth metacarpal.  No overlying erythema.  Pulse, motor, sensation intact.  No active bleeding or discharge.  Patient maintains good grip strength.  Good flexion-extension of the wrist.  No difficulty with ulnar or radial deviation.   Skin:    General: Skin is warm and dry.     Capillary Refill: Capillary refill takes less than 2 seconds.  Neurological:     Mental Status: She is alert. Mental status is at baseline.      ED Results / Procedures / Treatments  Labs (all labs ordered are listed, but only abnormal results are displayed) Labs Reviewed - No data to display   EKG Not applicable.   RADIOLOGY I personally viewed and evaluated these images as part of my medical decision making, as well as reviewing the written report by the radiologist.  ED Provider Interpretation: I agree with the interpretation of the radiologist.  Mild to moderate dorsal hand soft tissue swelling.  No acute fractures.  DG Wrist Complete Right  Result Date: 07/30/2021  CLINICAL DATA:  Injury mild chronic trying to do crowded with son and hitting hand on board yesterday. Swelling noted. EXAM: RIGHT HAND - COMPLETE 3+ VIEW; RIGHT WRIST - COMPLETE 3+ VIEW RIGHT WRIST - COMPLETE 3+ VIEW COMPARISON:  None. FINDINGS: Right hand: Normal bone mineralization. Mild to moderate dorsal hand soft tissue swelling at the level of the metacarpals. No acute fracture is seen. No dislocation. Right wrist: Minimal 1 mm positive ulnar variance. Joint spaces are preserved. No acute fracture is seen. No dislocation. IMPRESSION: Mild to moderate dorsal hand soft tissue swelling. No acute fracture is seen. Electronically Signed   By: Yvonne Kendall   On: 07/30/2021 19:15   DG Hand Complete Right  Result Date: 07/30/2021 CLINICAL DATA:  Injury mild chronic trying to do crowded with son and hitting hand on board yesterday. Swelling noted. EXAM: RIGHT HAND - COMPLETE 3+ VIEW; RIGHT WRIST -  COMPLETE 3+ VIEW RIGHT WRIST - COMPLETE 3+ VIEW COMPARISON:  None. FINDINGS: Right hand: Normal bone mineralization. Mild to moderate dorsal hand soft tissue swelling at the level of the metacarpals. No acute fracture is seen. No dislocation. Right wrist: Minimal 1 mm positive ulnar variance. Joint spaces are preserved. No acute fracture is seen. No dislocation. IMPRESSION: Mild to moderate dorsal hand soft tissue swelling. No acute fracture is seen. Electronically Signed   By: Yvonne Kendall   On: 07/30/2021 19:15    PROCEDURES:  Critical Care performed: None.  Procedures    MEDICATIONS ORDERED IN ED: Medications - No data to display   IMPRESSION / MDM / [place] / ED COURSE  I reviewed the triage vital signs and the nursing notes.                              Elizabeth Horton is a 36 y.o. female, unremarkable medical history, presents the emergency department for evaluation of head injury.  Patient states that she injured her right hand while trying to karate chop a wooden board yesterday.  She says the board was too thick and she was unable to break it.  Since then she has been experiencing significant pain on the lateral aspect of her right hand.  Reports being unable to clench her hand fully into a fist.  Denies fever/chills, wrist pain, forearm pain, elbow pain, upper arm/shoulder pain, chest pain, shortness of breath, headache, or abdominal pain.  Differential diagnosis includes, but is not limited to, boxer's fracture, scaphoid fracture, phalangeal fracture, dislocation, sprain.  Patient appears well.  She is sitting upright comfortably in bed.  NAD.  Physical exam notable for diffuse swelling in the right hand, particular on the ulnar aspect.  No overlying erythema.  No gross deformities.  Patient maintains good range of motion and grip strength.  X-ray shows moderate soft tissue swelling, but no underlying fracture or dislocation.  Given the patient's history,  physical exam, and work-up, I suspect that the patient likely has mild soft tissue injury to her hand. Low suspicion for extensor tendon rupture.  We will plan to discharge this patient with a hand brace, work note, pain management, and follow-up with orthopedics.  Patient was provided with anticipatory guidance, return precautions, and educational material. Encouraged the patient to return to the emergency department at any time if they begin to experience any new or worsening symptoms.       FINAL CLINICAL IMPRESSION(S) / ED DIAGNOSES   Final diagnoses:  Injury  of right hand, initial encounter     Rx / DC Orders   ED Discharge Orders          Ordered    HYDROcodone-acetaminophen (NORCO/VICODIN) 5-325 MG tablet  Every 6 hours PRN        07/30/21 2003             Note:  This document was prepared using Dragon voice recognition software and may include unintentional dictation errors.   Teodoro Spray, [place] 07/31/21 [id number]    Vanessa Hitchcock, MD 08/02/21 [phone]

## 2021-07-30 NOTE — Discharge Instructions (Addendum)
-  Please take your medications as prescribed.  Use hydrocodone sparingly. -Return to the emergency department anytime if you begin to experience any new or worsening symptoms. -Follow-up with the orthopedist listed above if your symptoms fail to improve.

## 2021-07-30 NOTE — ED Triage Notes (Signed)
Pt to ED for right hand injury while trying to do karate with son and hitting hand on board yesterday.  Swelling noted

## 2021-08-17 ENCOUNTER — Telehealth: Payer: Medicaid Other | Admitting: Family

## 2021-08-17 DIAGNOSIS — R399 Unspecified symptoms and signs involving the genitourinary system: Secondary | ICD-10-CM | POA: Diagnosis not present

## 2021-08-17 MED ORDER — CEPHALEXIN 500 MG PO CAPS: 500.0000 mg | ORAL_CAPSULE | Freq: Two times a day (BID) | ORAL | 0 refills | Status: DC

## 2021-08-17 NOTE — Progress Notes (Signed)
Virtual Visit Consent   Elizabeth Horton, you are scheduled for a virtual visit with a [facility] provider today.     Just as with appointments in the office, your consent must be obtained to participate.  Your consent will be active for this visit and any virtual visit you may have with one of our providers in the next 365 days.     If you have a MyChart account, a copy of this consent can be sent to you electronically.  All virtual visits are billed to your insurance company just like a traditional visit in the office.    As this is a virtual visit, video technology does not allow for your provider to perform a traditional examination.  This may limit your provider's ability to fully assess your condition.  If your provider identifies any concerns that need to be evaluated in person or the need to arrange testing (such as labs, EKG, etc.), we will make arrangements to do so.     Although advances in technology are sophisticated, we cannot ensure that it will always work on either your end or our end.  If the connection with a video visit is poor, the visit may have to be switched to a telephone visit.  With either a video or telephone visit, we are not always able to ensure that we have a secure connection.     I need to obtain your verbal consent now.   Are you willing to proceed with your visit today?    Jazlene Highman has provided verbal consent on 08/17/2021 for a virtual visit (video or telephone).   Evelina Dun, FNP   Date: 08/17/2021 9:49 AM   Virtual Visit via Video Note   I, Evelina Dun, connected with  Elizabeth Horton  ([id number], 1985/10/14) on 08/17/21 at  9:45 AM EST by a video-enabled telemedicine application and verified that I am speaking with the correct person using two identifiers.  Location: Patient: Virtual Visit Location Patient: Home Provider: Virtual Visit Location Provider: Home Office   I discussed the limitations of evaluation and management  by telemedicine and the availability of in person appointments. The patient expressed understanding and agreed to proceed.    History of Present Illness: Elizabeth Horton is a 36 y.o. who identifies as a female who was assigned female at birth, and is being seen today for dysuria.  HPI: Dysuria  This is a new problem. The current episode started yesterday. The problem occurs intermittently. The problem has been gradually worsening. The pain is at a severity of 6/10. The pain is mild. Associated symptoms include frequency and urgency. Pertinent negatives include no discharge, flank pain, hematuria, hesitancy, nausea or vomiting. She has tried increased fluids for the symptoms. The treatment provided mild relief.   Problems:  Patient Active Problem List   Diagnosis Date Noted   Sepsis ([facility]) 03/31/2018   PNA (pneumonia) 03/31/2018   Renal lesion/Infected Rt Kidney cyst 03/31/2018   Breast feeding status of mother 03/31/2018   AKI (acute kidney injury) ([facility]) 03/31/2018   S/P cesarean section 07/15/2013   H/O shoulder dystocia in prior pregnancy, currently pregnant 12/20/2012    Allergies: No Known Allergies Medications:  Current Outpatient Medications:    cephALEXin (KEFLEX) 500 MG capsule, Take 1 capsule (500 mg total) by mouth 2 (two) times daily., Disp: 14 capsule, Rfl: 0  Observations/Objective: Patient is well-developed, well-nourished in no acute distress.  Resting comfortably  at home.  Head is normocephalic, atraumatic.  No labored breathing.  Speech is clear and coherent with logical content.  Patient is alert and oriented at baseline.    Assessment and Plan: 1. UTI symptoms - cephALEXin (KEFLEX) 500 MG capsule; Take 1 capsule (500 mg total) by mouth 2 (two) times daily.  Dispense: 14 capsule; Refill: 0  Force fluids AZO over the counter X2 days RTO if symptoms worsen or do not improve   Follow Up Instructions: I discussed the assessment and treatment plan with the  patient. The patient was provided an opportunity to ask questions and all were answered. The patient agreed with the plan and demonstrated an understanding of the instructions.  A copy of instructions were sent to the patient via MyChart unless otherwise noted below.     The patient was advised to call back or seek an in-person evaluation if the symptoms worsen or if the condition fails to improve as anticipated.  Time:  I spent 7 minutes with the patient via telehealth technology discussing the above problems/concerns.    Evelina Dun, FNP

## 2021-10-26 ENCOUNTER — Telehealth: Payer: Medicaid Other | Admitting: Nurse Practitioner

## 2021-10-26 DIAGNOSIS — R399 Unspecified symptoms and signs involving the genitourinary system: Secondary | ICD-10-CM

## 2021-10-26 MED ORDER — CEPHALEXIN 500 MG PO CAPS: 500.0000 mg | ORAL_CAPSULE | Freq: Two times a day (BID) | ORAL | 0 refills | Status: DC

## 2021-10-26 NOTE — Progress Notes (Signed)
?Virtual Visit Consent  ? ?[name], you are scheduled for a virtual visit with a [facility] provider today.   ?  ?Just as with appointments in the office, your consent must be obtained to participate.  Your consent will be active for this visit and any virtual visit you may have with one of our providers in the next 365 days.   ?  ?If you have a MyChart account, a copy of this consent can be sent to you electronically.  All virtual visits are billed to your insurance company just like a traditional visit in the office.   ? ?As this is a virtual visit, video technology does not allow for your provider to perform a traditional examination.  This may limit your provider's ability to fully assess your condition.  If your provider identifies any concerns that need to be evaluated in person or the need to arrange testing (such as labs, EKG, etc.), we will make arrangements to do so.   ?  ?Although advances in technology are sophisticated, we cannot ensure that it will always work on either your end or our end.  If the connection with a video visit is poor, the visit may have to be switched to a telephone visit.  With either a video or telephone visit, we are not always able to ensure that we have a secure connection.    ? ?I need to obtain your verbal consent now.   Are you willing to proceed with your visit today?  ?  ?Elizabeth Horton has provided verbal consent on 10/26/2021 for a virtual visit (video or telephone). ?  ?Claiborne Rigg, NP  ? ?Date: 10/26/2021 2:52 PM ? ? ?Virtual Visit via Video Note  ? ?IClaiborne Rigg, connected with  Elizabeth Horton  ([id number], [birth year]) on 10/26/21 at  2:45 PM EDT by a video-enabled telemedicine application and verified that I am speaking with the correct person using two identifiers. ? ?Location: ?Patient: Virtual Visit Location Patient: Mobile ?Provider: Virtual Visit Location Provider: Home Office ?  ?I discussed the limitations of evaluation and  management by telemedicine and the availability of in person appointments. The patient expressed understanding and agreed to proceed.   ? ?History of Present Illness: ?Elizabeth Horton is a 36 y.o. who identifies as a female who was assigned female at birth, and is being seen today for UTI symptoms. ? ? ?She reports new onset dysuria and urinary frequency. The current episode started  several days ago and is staying constant. Patient states symptoms are moderate in intensity, occurring constantly. She  has been recently treated for similar symptoms in january 2023 and reports current symptoms feel similiar.  ?  ?Associated symptoms: ?No abdominal pain No back pain  ?No chills No constipation  ?No cramping No diarrhea  ?No discharge No fever  ?No hematuria No nausea  ?No vomiting   ? ?--------------------------------------------------------------------------------------- feels like she has a uti. Feels similiar. Frequent urination, pressure. no blood inurine.  ? ?still breasfeeing.  ?Problems:  ?Patient Active Problem List  ? Diagnosis Date Noted  ? Sepsis ([facility]) 03/31/2018  ? PNA (pneumonia) 03/31/2018  ? Renal lesion/Infected Rt Kidney cyst 03/31/2018  ? Breast feeding status of mother 03/31/2018  ? AKI (acute kidney injury) ([facility]) 03/31/2018  ? S/P cesarean section 07/15/2013  ? H/O shoulder dystocia in prior pregnancy, currently pregnant 12/20/2012  ?  ?Allergies: No Known Allergies ?Medications:  ?Current Outpatient Medications:  ?  cephALEXin (KEFLEX) 500  MG capsule, Take 1 capsule (500 mg total) by mouth 2 (two) times daily., Disp: 14 capsule, Rfl: 0 ? ?Observations/Objective: ?Patient is well-developed, well-nourished in no acute distress.  ?Resting comfortably in passenger seat of car   ?Head is normocephalic, atraumatic.  ?No labored breathing.  ?Speech is clear and coherent with logical content.  ?Patient is alert and oriented at baseline.  ? ? ?Assessment and Plan: ?1. UTI symptoms ?- cephALEXin (KEFLEX)  500 MG capsule; Take 1 capsule (500 mg total) by mouth 2 (two) times daily.  Dispense: 14 capsule; Refill: 0 ? ? ?Follow Up Instructions: ?I discussed the assessment and treatment plan with the patient. The patient was provided an opportunity to ask questions and all were answered. The patient agreed with the plan and demonstrated an understanding of the instructions.  A copy of instructions were sent to the patient via MyChart unless otherwise noted below.  ? ?The patient was advised to call back or seek an in-person evaluation if the symptoms worsen or if the condition fails to improve as anticipated. ? ?Time:  ?I spent 11 minutes with the patient via telehealth technology discussing the above problems/concerns.   ? ?Gildardo Pounds, NP  ?

## 2021-10-26 NOTE — Patient Instructions (Signed)
?  [name], thank you for joining Claiborne Rigg, NP for today's virtual visit.  While this provider is not your primary care provider (PCP), if your PCP is located in our provider database this encounter information will be shared with them immediately following your visit. ? ?Consent: ?(Patient) Elizabeth Horton provided verbal consent for this virtual visit at the beginning of the encounter. ? ?Current Medications: ? ?Current Outpatient Medications:  ?  cephALEXin (KEFLEX) 500 MG capsule, Take 1 capsule (500 mg total) by mouth 2 (two) times daily., Disp: 14 capsule, Rfl: 0  ? ?Medications ordered in this encounter:  ?Meds ordered this encounter  ?Medications  ? cephALEXin (KEFLEX) 500 MG capsule  ?  Sig: Take 1 capsule (500 mg total) by mouth 2 (two) times daily.  ?  Dispense:  14 capsule  ?  Refill:  0  ?  Order Specific Question:   Supervising Provider  ?  Answer:   Eber Hong [[id number]]  ?  ? ?*If you need refills on other medications prior to your next appointment, please contact your pharmacy* ? ?Follow-Up: ?Call back or seek an in-person evaluation if the symptoms worsen or if the condition fails to improve as anticipated. ? ?Other Instructions ?Remember to always wipe from front to back after urinating  ? ? ?If you have been instructed to have an in-person evaluation today at a local Urgent Care facility, please use the link below. It will take you to a list of all of our available [facility], including address, phone number and hours of operation. Please do not delay care.  ?[facility] ? ?If you or a family member do not have a primary care provider, use the link below to schedule a visit and establish care. When you choose a [facility] primary care physician or advanced practice provider, you gain a long-term partner in health. ?Find a Primary Care Provider ? ?Learn more about [facility]'s in-office and virtual care options: ?[facility] - Get Care Now  ?

## 2022-02-16 ENCOUNTER — Telehealth: Payer: Medicaid Other | Admitting: Physician Assistant

## 2022-02-16 ENCOUNTER — Encounter: Payer: Self-pay | Admitting: Physician Assistant

## 2022-02-16 DIAGNOSIS — L309 Dermatitis, unspecified: Secondary | ICD-10-CM | POA: Diagnosis not present

## 2022-02-16 MED ORDER — CLOTRIMAZOLE 1 % EX CREA: 1.0000 | TOPICAL_CREAM | Freq: Two times a day (BID) | CUTANEOUS | 0 refills | Status: DC

## 2022-02-16 NOTE — Progress Notes (Signed)
Virtual Visit Consent   Elizabeth Horton, you are scheduled for a virtual visit with a [facility] provider today. Just as with appointments in the office, your consent must be obtained to participate. Your consent will be active for this visit and any virtual visit you may have with one of our providers in the next 365 days. If you have a MyChart account, a copy of this consent can be sent to you electronically.  As this is a virtual visit, video technology does not allow for your provider to perform a traditional examination. This may limit your provider's ability to fully assess your condition. If your provider identifies any concerns that need to be evaluated in person or the need to arrange testing (such as labs, EKG, etc.), we will make arrangements to do so. Although advances in technology are sophisticated, we cannot ensure that it will always work on either your end or our end. If the connection with a video visit is poor, the visit may have to be switched to a telephone visit. With either a video or telephone visit, we are not always able to ensure that we have a secure connection.  By engaging in this virtual visit, you consent to the provision of healthcare and authorize for your insurance to be billed (if applicable) for the services provided during this visit. Depending on your insurance coverage, you may receive a charge related to this service.  I need to obtain your verbal consent now. Are you willing to proceed with your visit today? [name] has provided verbal consent on 02/16/2022 for a virtual visit (video or telephone). [name], [place]  Date: 02/16/2022 10:41 AM  Virtual Visit via Video Note   I, [name], connected with  Aianna Fahs  ([id number], Apr 27, 1986) on 02/16/22 at 10:30 AM EDT by a video-enabled telemedicine application and verified that I am speaking with the correct person using two identifiers.  Location: Patient: Virtual Visit  Location Patient: Home Provider: Virtual Visit Location Provider: Home Office   I discussed the limitations of evaluation and management by telemedicine and the availability of in person appointments. The patient expressed understanding and agreed to proceed.    History of Present Illness: Elizabeth Horton is a 36 y.o. who identifies as a female who was assigned female at birth, and is being seen today for nipple concern.  Breast concern She started an antibiotic about a month ago for UTI. Took this as prescribed and UTI symptoms resolved. She started to develop irritation on her nipples -- some scabbing, itching, shiny areas. She has tried: neosporin, wash with mild soap and water, cleaning bras regularly.  She has 5 children and this has happened once before. She is uncertain if her son has any evidence of thrush.   HPI: HPI  Problems:  Patient Active Problem List   Diagnosis Date Noted   Sepsis (HCC) 03/31/2018   PNA (pneumonia) 03/31/2018   Renal lesion/Infected Rt Kidney cyst 03/31/2018   Breast feeding status of mother 03/31/2018   AKI (acute kidney injury) ([facility]) 03/31/2018   S/P cesarean section 07/15/2013   H/O shoulder dystocia in prior pregnancy, currently pregnant 12/20/2012    Allergies: No Known Allergies Medications:  Current Outpatient Medications:    clotrimazole (LOTRIMIN) 1 % cream, Apply 1 Application topically 2 (two) times daily., Disp: 30 g, Rfl: 0   cephALEXin (KEFLEX) 500 MG capsule, Take 1 capsule (500 mg total) by mouth 2 (two) times daily., Disp: 14 capsule, Rfl:  0  Observations/Objective: Patient is well-developed, well-nourished in no acute distress.  Resting comfortably  at home.  Head is normocephalic, atraumatic.  No labored breathing.  Speech is clear and coherent with logical content.  Patient is alert and oriented at baseline.   Assessment and Plan: Nipple dermatitis; Breast feeding status of mother Suspect nipple yeast  dermatitis Recommended topical clotrimazole applied to nipple Prior to each feeding, clean residual medication with olive or coconut oil as regular soap and water can irritate the nipples After feeding, reapply If worsening, needs evaluation for possible oral diflucan regimen if appropriate  Follow Up Instructions: I discussed the assessment and treatment plan with the patient. The patient was provided an opportunity to ask questions and all were answered. The patient agreed with the plan and demonstrated an understanding of the instructions.  A copy of instructions were sent to the patient via MyChart unless otherwise noted below.    The patient was advised to call back or seek an in-person evaluation if the symptoms worsen or if the condition fails to improve as anticipated.  Time:  I spent 5-10 minutes with the patient via telehealth technology discussing the above problems/concerns.    [name], [place]

## 2022-02-16 NOTE — Patient Instructions (Signed)
It was great to see you!  I have sent in topical clotrimazole cream to the [facility] in [place] for you  Please apply to this your nipples  Prior to each feeding, clean residual medication with olive or coconut oil because regular soap and water can irritate the nipples  After feedings, reapply the cream  If this does not improve symptoms, follow-up with ob or another provider to discuss more options  Take care,  [name] PA-C

## 2022-02-17 MED ORDER — CLOTRIMAZOLE 1 % EX CREA: 1.0000 | TOPICAL_CREAM | Freq: Two times a day (BID) | CUTANEOUS | 0 refills | Status: DC

## 2022-09-21 ENCOUNTER — Emergency Department: Payer: Medicaid Other

## 2022-09-21 ENCOUNTER — Other Ambulatory Visit: Payer: Self-pay

## 2022-09-21 ENCOUNTER — Emergency Department
Admission: EM | Admit: 2022-09-21 | Discharge: 2022-09-21 | Disposition: A | Discharge status: Home / Self Care | Payer: Medicaid Other | Attending: Emergency Medicine | Admitting: Emergency Medicine

## 2022-09-21 ENCOUNTER — Encounter: Payer: Self-pay | Admitting: Emergency Medicine

## 2022-09-21 DIAGNOSIS — U071 COVID-19: Secondary | ICD-10-CM | POA: Diagnosis not present

## 2022-09-21 DIAGNOSIS — R8271 Bacteriuria: Secondary | ICD-10-CM | POA: Insufficient documentation

## 2022-09-21 DIAGNOSIS — J069 Acute upper respiratory infection, unspecified: Secondary | ICD-10-CM

## 2022-09-21 DIAGNOSIS — O99891 Other specified diseases and conditions complicating pregnancy: Secondary | ICD-10-CM

## 2022-09-21 DIAGNOSIS — Z3A27 27 weeks gestation of pregnancy: Secondary | ICD-10-CM | POA: Insufficient documentation

## 2022-09-21 DIAGNOSIS — O2342 Unspecified infection of urinary tract in pregnancy, second trimester: Secondary | ICD-10-CM | POA: Diagnosis not present

## 2022-09-21 DIAGNOSIS — O98512 Other viral diseases complicating pregnancy, second trimester: Secondary | ICD-10-CM | POA: Diagnosis not present

## 2022-09-21 LAB — CBC WITH DIFFERENTIAL/PLATELET
Abs Immature Granulocytes: 0.03 10*3/uL (ref 0.00–0.07)
Basophils Absolute: 0 10*3/uL (ref 0.0–0.1)
Basophils Relative: 0 %
Eosinophils Absolute: 0.1 10*3/uL (ref 0.0–0.5)
Eosinophils Relative: 1 %
HCT: 30.2 % — ABNORMAL LOW (ref 36.0–46.0)
Hemoglobin: 10.1 g/dL — ABNORMAL LOW (ref 12.0–15.0)
Immature Granulocytes: 0 %
Lymphocytes Relative: 10 %
Lymphs Abs: 0.6 10*3/uL — ABNORMAL LOW (ref 0.7–4.0)
MCH: 28.9 pg (ref 26.0–34.0)
MCHC: 33.4 g/dL (ref 30.0–36.0)
MCV: 86.3 fL (ref 80.0–100.0)
Monocytes Absolute: 0.6 10*3/uL (ref 0.1–1.0)
Monocytes Relative: 9 %
Neutro Abs: 5.4 10*3/uL (ref 1.7–7.7)
Neutrophils Relative %: 80 %
Platelets: 224 10*3/uL (ref 150–400)
RBC: 3.5 MIL/uL — ABNORMAL LOW (ref 3.87–5.11)
RDW: 13.4 % (ref 11.5–15.5)
WBC: 6.7 10*3/uL (ref 4.0–10.5)
nRBC: 0 % (ref 0.0–0.2)

## 2022-09-21 LAB — URINALYSIS, ROUTINE W REFLEX MICROSCOPIC
Bilirubin Urine: NEGATIVE
Glucose, UA: NEGATIVE mg/dL
Hgb urine dipstick: NEGATIVE
Ketones, ur: 5 mg/dL — AB
Nitrite: NEGATIVE
Protein, ur: NEGATIVE mg/dL
Specific Gravity, Urine: 1.006 (ref 1.005–1.030)
pH: 7 (ref 5.0–8.0)

## 2022-09-21 LAB — BASIC METABOLIC PANEL
Anion gap: 10 (ref 5–15)
BUN: <5 mg/dL — ABNORMAL LOW (ref 6–20)
CO2: 19 mmol/L — ABNORMAL LOW (ref 22–32)
Calcium: 8.2 mg/dL — ABNORMAL LOW (ref 8.9–10.3)
Chloride: 102 mmol/L (ref 98–111)
Creatinine, Ser: 0.71 mg/dL (ref 0.44–1.00)
GFR, Estimated: >60 mL/min (ref >60)
Glucose, Bld: 100 mg/dL — ABNORMAL HIGH (ref 70–99)
Potassium: 3.5 mmol/L (ref 3.5–5.1)
Sodium: 131 mmol/L — ABNORMAL LOW (ref 135–145)

## 2022-09-21 LAB — RESP PANEL BY RT-PCR (RSV, FLU A&B, COVID)  RVPGX2
Influenza A by PCR: NEGATIVE
Influenza B by PCR: NEGATIVE
Resp Syncytial Virus by PCR: NEGATIVE
SARS Coronavirus 2 by RT PCR: POSITIVE — AB

## 2022-09-21 MED ORDER — CEPHALEXIN 500 MG PO CAPS
500.0000 mg | ORAL_CAPSULE | Freq: Once | ORAL | Status: AC
  Administered 2022-09-21: 500 mg via ORAL
  Filled 2022-09-21: qty 1

## 2022-09-21 MED ORDER — ONDANSETRON HCL 4 MG/2ML IJ SOLN
4.0000 mg | Freq: Once | INTRAMUSCULAR | Status: AC
  Administered 2022-09-21: 4 mg via INTRAVENOUS
  Filled 2022-09-21: qty 2

## 2022-09-21 MED ORDER — ACETAMINOPHEN 500 MG PO TABS
1000.0000 mg | ORAL_TABLET | Freq: Once | ORAL | Status: AC
  Administered 2022-09-21: 1000 mg via ORAL
  Filled 2022-09-21: qty 2

## 2022-09-21 MED ORDER — ONDANSETRON HCL 4 MG PO TABS: 4.0000 mg | ORAL_TABLET | Freq: Every day | ORAL | 1 refills | Status: AC | PRN

## 2022-09-21 MED ORDER — CEPHALEXIN 500 MG PO CAPS: 500.0000 mg | ORAL_CAPSULE | Freq: Four times a day (QID) | ORAL | 0 refills | Status: AC

## 2022-09-21 MED ORDER — SODIUM CHLORIDE 0.9 % IV BOLUS
1000.0000 mL | Freq: Once | INTRAVENOUS | Status: AC
  Administered 2022-09-21: 1000 mL via INTRAVENOUS

## 2022-09-21 MED ORDER — NIRMATRELVIR/RITONAVIR (PAXLOVID)TABLET: 3.0000 | ORAL_TABLET | Freq: Two times a day (BID) | ORAL | 0 refills | Status: AC

## 2022-09-21 NOTE — ED Provider Notes (Addendum)
[facility] Provider Note    Event Date/Time   First MD Initiated Contact with Patient 09/21/22 1502     (approximate)   History   URI   HPI  Elizabeth Horton is a 37 y.o. female   Past medical history of significant past medical history is [redacted] weeks pregnant uncomplicated pregnancy thus far who presents with several days of myalgias, headache, nasal congestion, sore throat, productive cough and fever.  She has developed some chest and abdominal soreness after coughing for several days.  Poor p.o. intake, nausea and occasional vomiting.  No GI bleeding.  Denies any GU symptoms, vaginal bleeding, gush of fluids.   External Medical Documents Reviewed: OB/GYN ultrasound from 09/12/2022 that shows single live intrauterine pregnancy      Physical Exam   Triage Vital Signs: ED Triage Vitals  Enc Vitals Group     BP 09/21/22 1443 119/76     Pulse Rate 09/21/22 1443 (!) 115     Resp 09/21/22 1443 20     Temp 09/21/22 1443 (!) 100.4 F (38 C)     Temp src --      SpO2 09/21/22 1443 96 %     Weight 09/21/22 1441 200 lb (90.7 kg)     Height 09/21/22 1441 '5\' 3"'$  (1.6 m)     Head Circumference --      Peak Flow --      Pain Score 09/21/22 1441 6     Pain Loc --      Pain Edu? --      Excl. in [place]? --     Most recent vital signs: Vitals:   09/21/22 1443 09/21/22 1649  BP: 119/76   Pulse: (!) 115 97  Resp: 20   Temp: (!) 100.4 F (38 C)   SpO2: 96%     General: Awake, no distress.  CV:  Good peripheral perfusion.  Resp:  Normal effort.  Abd:  No distention.  Other:  Lungs clear without focality or wheezing.  Tachycardic 110s normotensive and febrile 100.4.  No respiratory distress.  No hypoxemia.  Abdomen is gravid nontender.  Skin appears warm well-perfused.  Posterior oropharynx appears normal.  Bilateral TMs without signs of effusion or infection.  Neck supple with full range of motion nontoxic   ED Results / Procedures / Treatments    Labs (all labs ordered are listed, but only abnormal results are displayed) Labs Reviewed  RESP PANEL BY RT-PCR (RSV, FLU A&B, COVID)  RVPGX2 - Abnormal; Notable for the following components:      Result Value   SARS Coronavirus 2 by RT PCR POSITIVE (*)    All other components within normal limits  BASIC METABOLIC PANEL - Abnormal; Notable for the following components:   Sodium 131 (*)    CO2 19 (*)    Glucose, Bld 100 (*)    BUN <5 (*)    Calcium 8.2 (*)    All other components within normal limits  CBC WITH DIFFERENTIAL/PLATELET - Abnormal; Notable for the following components:   RBC 3.50 (*)    Hemoglobin 10.1 (*)    HCT 30.2 (*)    Lymphs Abs 0.6 (*)    All other components within normal limits  URINALYSIS, ROUTINE W REFLEX MICROSCOPIC - Abnormal; Notable for the following components:   Color, Urine YELLOW (*)    APPearance CLEAR (*)    Ketones, ur 5 (*)    Leukocytes,Ua TRACE (*)    Bacteria,  UA RARE (*)    All other components within normal limits  URINE CULTURE     I ordered and reviewed the above labs they are notable for covid positive.   RADIOLOGY I independently reviewed and interpreted chest x-ray see no obvious focalities or pneumothorax   PROCEDURES:  Critical Care performed: No  Procedures   MEDICATIONS ORDERED IN ED: Medications  cephALEXin (KEFLEX) capsule 500 mg (has no administration in time range)  acetaminophen (TYLENOL) tablet 1,000 mg (1,000 mg Oral Given 09/21/22 1603)  sodium chloride 0.9 % bolus 1,000 mL (1,000 mLs Intravenous New Bag/Given 09/21/22 1602)  ondansetron (ZOFRAN) injection 4 mg (4 mg Intravenous Given 09/21/22 1602)    IMPRESSION / MDM / ASSESSMENT AND PLAN / ED COURSE  I reviewed the triage vital signs and the nursing notes.                                Patient's presentation is most consistent with acute presentation with potential threat to life or bodily function.  Differential diagnosis includes, but is not  limited to, viral URI like COVID or flu, bacterial pneumonia, dehydration electrolyte derangement, sinusitis   The patient is on the cardiac monitor to evaluate for evidence of arrhythmia and/or significant heart rate changes.  MDM: Viral URI symptoms and exam consistent without wheezing, respiratory distress or focalities.  Febrile and tachycardic otherwise nontoxic-appearing.  Doubt meningitis or sepsis.  Will give IV fluid bolus, antiemetic, antipyretic and reassess.  Chest x-ray basic labs and viral swab.   I considered hospitalization for admission or observation since vital signs improved after IV fluids and antipyretics, COVID-positive, otherwise well-appearing will discharge with obgyn follow-up and an outpatient prescription for Paxlovid & Zofran.  Symptomatic bacteriuria will be treated with a course of antibiotics.  Vital signs improved with antipyretics and fluids.  With her constellation of symptoms and COVID-positive I doubt there is other cardiopulmonary emergencies like PE currently.  Return precautions given, discharged.      FINAL CLINICAL IMPRESSION(S) / ED DIAGNOSES   Final diagnoses:  Upper respiratory tract infection, unspecified type  COVID  Bacteriuria during pregnancy     Rx / DC Orders   ED Discharge Orders          Ordered    ondansetron (ZOFRAN) 4 MG tablet  Daily PRN        09/21/22 1603    nirmatrelvir/ritonavir (PAXLOVID) 20 x 150 MG & 10 x '100MG'$  TABS  2 times daily        09/21/22 1603    cephALEXin (KEFLEX) 500 MG capsule  4 times daily        09/21/22 1722             Note:  This document was prepared using Dragon voice recognition software and may include unintentional dictation errors.    Lucillie Garfinkel, MD 09/21/22 [id number]    Lucillie Garfinkel, MD 09/21/22 [phone]

## 2022-09-21 NOTE — ED Triage Notes (Signed)
Pt via POV from home. Pt c/o cough, headache, and nasal congestion for the past couple of days. Her child has also been sick. Pt is [redacted] weeks pregnant. Denies any pregnancy related concerns. Pt is A&OX4 and NAD

## 2022-09-21 NOTE — Discharge Instructions (Addendum)
You have COVID infection.  Take Paxlovid as prescribed.  Take Tylenol 650 mg every 6 hours for fever and pain.  Stay hydrated by drinking plenty of fluids-find Pedialyte or similar electrolyte rehydration formulas at a local pharmacy.  Take Zofran for nausea and vomiting as needed.  See your obstetrics provider or primary doctor for follow-up this week.  You have bacteria in your urine.  Is important to take antibiotics to help clear bacteria in your urine while you are pregnant.  Take Keflex as prescribed for the full course.

## 2022-09-22 LAB — URINE CULTURE: Culture: NO GROWTH

## 2023-03-05 ENCOUNTER — Telehealth: Payer: Medicaid Other | Admitting: Physician Assistant

## 2023-03-05 ENCOUNTER — Ambulatory Visit: Payer: Self-pay

## 2023-03-05 DIAGNOSIS — R3989 Other symptoms and signs involving the genitourinary system: Secondary | ICD-10-CM | POA: Diagnosis not present

## 2023-03-05 DIAGNOSIS — T3695XA Adverse effect of unspecified systemic antibiotic, initial encounter: Secondary | ICD-10-CM

## 2023-03-05 DIAGNOSIS — B379 Candidiasis, unspecified: Secondary | ICD-10-CM | POA: Diagnosis not present

## 2023-03-05 MED ORDER — FLUCONAZOLE 150 MG PO TABS: 150.0000 mg | ORAL_TABLET | ORAL | 0 refills | Status: DC | PRN

## 2023-03-05 MED ORDER — CEPHALEXIN 500 MG PO CAPS
500.0000 mg | ORAL_CAPSULE | Freq: Two times a day (BID) | ORAL | 0 refills | Status: DC
Start: 2023-03-05 — End: 2023-12-01

## 2023-03-05 NOTE — Progress Notes (Signed)
Virtual Visit Consent   Elizabeth Horton, you are scheduled for a virtual visit with a [facility] provider today. Just as with appointments in the office, your consent must be obtained to participate. Your consent will be active for this visit and any virtual visit you may have with one of our providers in the next 365 days. If you have a MyChart account, a copy of this consent can be sent to you electronically.  As this is a virtual visit, video technology does not allow for your provider to perform a traditional examination. This may limit your provider's ability to fully assess your condition. If your provider identifies any concerns that need to be evaluated in person or the need to arrange testing (such as labs, EKG, etc.), we will make arrangements to do so. Although advances in technology are sophisticated, we cannot ensure that it will always work on either your end or our end. If the connection with a video visit is poor, the visit may have to be switched to a telephone visit. With either a video or telephone visit, we are not always able to ensure that we have a secure connection.  By engaging in this virtual visit, you consent to the provision of healthcare and authorize for your insurance to be billed (if applicable) for the services provided during this visit. Depending on your insurance coverage, you may receive a charge related to this service.  I need to obtain your verbal consent now. Are you willing to proceed with your visit today? Elizabeth Horton has provided verbal consent on 03/05/2023 for a virtual visit (video or telephone). Margaretann Loveless, PA-C  Date: 03/05/2023 11:10 AM  Virtual Visit via Video Note   I, Margaretann Loveless, connected with  Elizabeth Horton  ([id number], 04/18/1986) on 03/05/23 at 11:00 AM EDT by a video-enabled telemedicine application and verified that I am speaking with the correct person using two identifiers.  Location: Patient: Virtual  Visit Location Patient: Home Provider: Virtual Visit Location Provider: Home Office   I discussed the limitations of evaluation and management by telemedicine and the availability of in person appointments. The patient expressed understanding and agreed to proceed.    History of Present Illness: Elizabeth Horton is a 37 y.o. who identifies as a female who was assigned female at birth, and is being seen today for possible UTI.  HPI: Urinary Tract Infection  This is a new problem. The current episode started yesterday. The problem occurs every urination. The problem has been gradually worsening. The patient is experiencing no pain. There has been no fever. Associated symptoms include frequency, hesitancy and urgency. Pertinent negatives include no chills, discharge, flank pain, hematuria, nausea or vomiting. She has tried increased fluids for the symptoms. The treatment provided no relief.   Breastfeeding  Problems:  Patient Active Problem List   Diagnosis Date Noted   Sepsis (HCC) 03/31/2018   PNA (pneumonia) 03/31/2018   Renal lesion/Infected Rt Kidney cyst 03/31/2018   Breast feeding status of mother 03/31/2018   AKI (acute kidney injury) (HCC) 03/31/2018   S/P cesarean section 07/15/2013   H/O shoulder dystocia in prior pregnancy, currently pregnant 12/20/2012    Allergies: No Known Allergies Medications:  Current Outpatient Medications:    cephALEXin (KEFLEX) 500 MG capsule, Take 1 capsule (500 mg total) by mouth 2 (two) times daily., Disp: 14 capsule, Rfl: 0   fluconazole (DIFLUCAN) 150 MG tablet, Take 1 tablet (150 mg total) by mouth every 3 (three)  days as needed., Disp: 2 tablet, Rfl: 0   clotrimazole (LOTRIMIN) 1 % cream, Apply 1 Application topically 2 (two) times daily., Disp: 30 g, Rfl: 0   ondansetron (ZOFRAN) 4 MG tablet, Take 1 tablet (4 mg total) by mouth daily as needed for nausea or vomiting., Disp: 30 tablet, Rfl: 1  Observations/Objective: Patient is  well-developed, well-nourished in no acute distress.  Resting comfortably at home.  Head is normocephalic, atraumatic.  No labored breathing.  Speech is clear and coherent with logical content.  Patient is alert and oriented at baseline.    Assessment and Plan: 1. Suspected UTI - cephALEXin (KEFLEX) 500 MG capsule; Take 1 capsule (500 mg total) by mouth 2 (two) times daily.  Dispense: 14 capsule; Refill: 0  2. Antibiotic-induced yeast infection - fluconazole (DIFLUCAN) 150 MG tablet; Take 1 tablet (150 mg total) by mouth every 3 (three) days as needed.  Dispense: 2 tablet; Refill: 0  - Worsening symptoms.  - Will treat empirically with Keflex - Diflucan given as prophylaxis as patient tends to get vaginal yeast infections with antibiotic use - May use AZO for bladder spasms - Continue to push fluids.  - Seek in person evaluation for urine culture if symptoms do not improve or if they worsen.    Follow Up Instructions: I discussed the assessment and treatment plan with the patient. The patient was provided an opportunity to ask questions and all were answered. The patient agreed with the plan and demonstrated an understanding of the instructions.  A copy of instructions were sent to the patient via MyChart unless otherwise noted below.    The patient was advised to call back or seek an in-person evaluation if the symptoms worsen or if the condition fails to improve as anticipated.  Time:  I spent 8 minutes with the patient via telehealth technology discussing the above problems/concerns.    Margaretann Loveless, PA-C

## 2023-03-05 NOTE — Patient Instructions (Signed)
[name], thank you for joining Margaretann Loveless, PA-C for today's virtual visit.  While this provider is not your primary care provider (PCP), if your PCP is located in our provider database this encounter information will be shared with them immediately following your visit.   A [facility] MyChart account gives you access to today's visit and all your visits, tests, and labs performed at [facility] " click here if you don't have a [facility] MyChart account or go to [email or web address]  Consent: (Patient) Elizabeth Horton provided verbal consent for this virtual visit at the beginning of the encounter.  Current Medications:  Current Outpatient Medications:    cephALEXin (KEFLEX) 500 MG capsule, Take 1 capsule (500 mg total) by mouth 2 (two) times daily., Disp: 14 capsule, Rfl: 0   fluconazole (DIFLUCAN) 150 MG tablet, Take 1 tablet (150 mg total) by mouth every 3 (three) days as needed., Disp: 2 tablet, Rfl: 0   clotrimazole (LOTRIMIN) 1 % cream, Apply 1 Application topically 2 (two) times daily., Disp: 30 g, Rfl: 0   ondansetron (ZOFRAN) 4 MG tablet, Take 1 tablet (4 mg total) by mouth daily as needed for nausea or vomiting., Disp: 30 tablet, Rfl: 1   Medications ordered in this encounter:  Meds ordered this encounter  Medications   cephALEXin (KEFLEX) 500 MG capsule    Sig: Take 1 capsule (500 mg total) by mouth 2 (two) times daily.    Dispense:  14 capsule    Refill:  0    Order Specific Question:   Supervising Provider    Answer:   Merrilee Jansky [[id number]]   fluconazole (DIFLUCAN) 150 MG tablet    Sig: Take 1 tablet (150 mg total) by mouth every 3 (three) days as needed.    Dispense:  2 tablet    Refill:  0    Order Specific Question:   Supervising Provider    Answer:   Merrilee Jansky [id number]     *If you need refills on other medications prior to your next appointment, please contact your pharmacy*  Follow-Up: Call back or seek  an in-person evaluation if the symptoms worsen or if the condition fails to improve as anticipated.  [facility] [phone]  Other Instructions Urinary Tract Infection, Adult  A urinary tract infection (UTI) is an infection of any part of the urinary tract. The urinary tract includes the kidneys, ureters, bladder, and urethra. These organs make, store, and get rid of urine in the body. An upper UTI affects the ureters and kidneys. A lower UTI affects the bladder and urethra. What are the causes? Most urinary tract infections are caused by bacteria in your genital area around your urethra, where urine leaves your body. These bacteria grow and cause inflammation of your urinary tract. What increases the risk? You are more likely to develop this condition if: You have a urinary catheter that stays in place. You are not able to control when you urinate or have a bowel movement (incontinence). You are female and you: Use a spermicide or diaphragm for birth control. Have low estrogen levels. Are pregnant. You have certain genes that increase your risk. You are sexually active. You take antibiotic medicines. You have a condition that causes your flow of urine to slow down, such as: An enlarged prostate, if you are female. Blockage in your urethra. A kidney stone. A nerve condition that affects your bladder control (neurogenic bladder). Not getting enough to drink,  or not urinating often. You have certain medical conditions, such as: Diabetes. A weak disease-fighting system (immunesystem). Sickle cell disease. Gout. Spinal cord injury. What are the signs or symptoms? Symptoms of this condition include: Needing to urinate right away (urgency). Frequent urination. This may include small amounts of urine each time you urinate. Pain or burning with urination. Blood in the urine. Urine that smells bad or unusual. Trouble urinating. Cloudy urine. Vaginal discharge, if you  are female. Pain in the abdomen or the lower back. You may also have: Vomiting or a decreased appetite. Confusion. Irritability or tiredness. A fever or chills. Diarrhea. The first symptom in older adults may be confusion. In some cases, they may not have any symptoms until the infection has worsened. How is this diagnosed? This condition is diagnosed based on your medical history and a physical exam. You may also have other tests, including: Urine tests. Blood tests. Tests for STIs (sexually transmitted infections). If you have had more than one UTI, a cystoscopy or imaging studies may be done to determine the cause of the infections. How is this treated? Treatment for this condition includes: Antibiotic medicine. Over-the-counter medicines to treat discomfort. Drinking enough water to stay hydrated. If you have frequent infections or have other conditions such as a kidney stone, you may need to see a health care provider who specializes in the urinary tract (urologist). In rare cases, urinary tract infections can cause sepsis. Sepsis is a life-threatening condition that occurs when the body responds to an infection. Sepsis is treated in the hospital with IV antibiotics, fluids, and other medicines. Follow these instructions at home:  Medicines Take over-the-counter and prescription medicines only as told by your health care provider. If you were prescribed an antibiotic medicine, take it as told by your health care provider. Do not stop using the antibiotic even if you start to feel better. General instructions Make sure you: Empty your bladder often and completely. Do not hold urine for long periods of time. Empty your bladder after sex. Wipe from front to back after urinating or having a bowel movement if you are female. Use each tissue only one time when you wipe. Drink enough fluid to keep your urine pale yellow. Keep all follow-up visits. This is important. Contact a health  care provider if: Your symptoms do not get better after 1-2 days. Your symptoms go away and then return. Get help right away if: You have severe pain in your back or your lower abdomen. You have a fever or chills. You have nausea or vomiting. Summary A urinary tract infection (UTI) is an infection of any part of the urinary tract, which includes the kidneys, ureters, bladder, and urethra. Most urinary tract infections are caused by bacteria in your genital area. Treatment for this condition often includes antibiotic medicines. If you were prescribed an antibiotic medicine, take it as told by your health care provider. Do not stop using the antibiotic even if you start to feel better. Keep all follow-up visits. This is important. This information is not intended to replace advice given to you by your health care provider. Make sure you discuss any questions you have with your health care provider. Document Revised: 02/12/2020 Document Reviewed: 02/17/2020 Elsevier Patient Education  2024 Elsevier Inc.    If you have been instructed to have an in-person evaluation today at a local Urgent Care facility, please use the link below. It will take you to a list of all of our available [facility]  Urgent Cares, including address, phone number and hours of operation. Please do not delay care.  [facility]  If you or a family member do not have a primary care provider, use the link below to schedule a visit and establish care. When you choose a [facility] primary care physician or advanced practice provider, you gain a long-term partner in health. Find a Primary Care Provider  Learn more about [facility]'s in-office and virtual care options: [facility] - Get Care Now

## 2023-12-01 ENCOUNTER — Telehealth: Admitting: Physician Assistant

## 2023-12-01 DIAGNOSIS — J320 Chronic maxillary sinusitis: Secondary | ICD-10-CM | POA: Diagnosis not present

## 2023-12-01 MED ORDER — LORATADINE 10 MG PO TABS: 10.0000 mg | ORAL_TABLET | Freq: Every day | ORAL | 11 refills | Status: DC

## 2023-12-01 MED ORDER — AMOXICILLIN 875 MG PO TABS
875.0000 mg | ORAL_TABLET | Freq: Two times a day (BID) | ORAL | 0 refills | Status: AC
Start: 2023-12-01 — End: 2023-12-11

## 2023-12-01 MED ORDER — FLUTICASONE PROPIONATE 50 MCG/ACT NA SUSP
2.0000 | Freq: Every day | NASAL | 0 refills | Status: DC
Start: 2023-12-01 — End: 2023-12-23

## 2023-12-01 NOTE — Progress Notes (Signed)
 Virtual Visit Consent   Elizabeth Horton, you are scheduled for a virtual visit with a [facility] provider today. Just as with appointments in the office, your consent must be obtained to participate. Your consent will be active for this visit and any virtual visit you may have with one of our providers in the next 365 days. If you have a MyChart account, a copy of this consent can be sent to you electronically.  As this is a virtual visit, video technology does not allow for your provider to perform a traditional examination. This may limit your provider's ability to fully assess your condition. If your provider identifies any concerns that need to be evaluated in person or the need to arrange testing (such as labs, EKG, etc.), we will make arrangements to do so. Although advances in technology are sophisticated, we cannot ensure that it will always work on either your end or our end. If the connection with a video visit is poor, the visit may have to be switched to a telephone visit. With either a video or telephone visit, we are not always able to ensure that we have a secure connection.  By engaging in this virtual visit, you consent to the provision of healthcare and authorize for your insurance to be billed (if applicable) for the services provided during this visit. Depending on your insurance coverage, you may receive a charge related to this service.  I need to obtain your verbal consent now. Are you willing to proceed with your visit today? Elizabeth Horton has provided verbal consent on 12/01/2023 for a virtual visit (video or telephone). Elizabeth Horton, [place]  Date: 12/01/2023 2:55 PM   Virtual Visit via Video Note   I, Elizabeth Horton, connected with  Elizabeth Horton  ([id number], April 30, 1986) on 12/01/23 at  2:45 PM EDT by a video-enabled telemedicine application and verified that I am speaking with the correct person using two identifiers.  Location: Patient: Virtual  Visit Location Patient: Home Provider: Virtual Visit Location Provider: Home Office   I discussed the limitations of evaluation and management by telemedicine and the availability of in person appointments. The patient expressed understanding and agreed to proceed.    History of Present Illness: Elizabeth Horton is a 38 y.o. who identifies as a female who was assigned female at birth, and is being seen today for increased nasal and sinus congestion over the past 2-3 weeks with change in nasal mucous. Notes more issues with allergies and sinusitis since having COVID last year. Now with sinus pressure, headache and sinus pain that seems worse in the evening time. Denies fever, chills.   No concerns of pregnancy. Is breastfeeding currently.   HPI: HPI  Problems:  Patient Active Problem List   Diagnosis Date Noted   Sepsis (HCC) 03/31/2018   PNA (pneumonia) 03/31/2018   Renal lesion/Infected Rt Kidney cyst 03/31/2018   Breast feeding status of mother 03/31/2018   AKI (acute kidney injury) (HCC) 03/31/2018   S/P cesarean section 07/15/2013   H/O shoulder dystocia in prior pregnancy, currently pregnant 12/20/2012    Allergies: No Known Allergies Medications:  Current Outpatient Medications:    amoxicillin  (AMOXIL ) 875 MG tablet, Take 1 tablet (875 mg total) by mouth 2 (two) times daily for 10 days., Disp: 20 tablet, Rfl: 0   fluticasone (FLONASE) 50 MCG/ACT nasal spray, Place 2 sprays into both nostrils daily., Disp: 16 g, Rfl: 0   loratadine (CLARITIN) 10 MG tablet, Take 1 tablet (  10 mg total) by mouth daily., Disp: 30 tablet, Rfl: 11  Observations/Objective: Patient is well-developed, well-nourished in no acute distress.  Resting comfortably at home.  Head is normocephalic, atraumatic.  No labored breathing. Speech is clear and coherent with logical content.  Patient is alert and oriented at baseline.   Assessment and Plan: 1. Chronic maxillary sinusitis (Primary) - fluticasone  (FLONASE) 50 MCG/ACT nasal spray; Place 2 sprays into both nostrils daily.  Dispense: 16 g; Refill: 0 - loratadine (CLARITIN) 10 MG tablet; Take 1 tablet (10 mg total) by mouth daily.  Dispense: 30 tablet; Refill: 11 - amoxicillin  (AMOXIL ) 875 MG tablet; Take 1 tablet (875 mg total) by mouth 2 (two) times daily for 10 days.  Dispense: 20 tablet; Refill: 0  Rx Amoxicillin  (breastfeeding).  Increase fluids.  Rest.  Saline nasal spray.  Probiotic.  Mucinex  as directed.  Humidifier in bedroom. Flonase and Claritin per orders.  Want her to establish with PCP as she needs imaging and ENT referral due   Call or return to clinic if symptoms are not improving.   Follow Up Instructions: I discussed the assessment and treatment plan with the patient. The patient was provided an opportunity to ask questions and all were answered. The patient agreed with the plan and demonstrated an understanding of the instructions.  A copy of instructions were sent to the patient via MyChart unless otherwise noted below.   The patient was advised to call back or seek an in-person evaluation if the symptoms worsen or if the condition fails to improve as anticipated.    Elizabeth Maillard, PA-C

## 2023-12-01 NOTE — Patient Instructions (Signed)
 [name], thank you for joining [name], PA-C for today's virtual visit.  While this provider is not your primary care provider (PCP), if your PCP is located in our provider database this encounter information will be shared with them immediately following your visit.   A [place] MyChart account gives you access to today's visit and all your visits, tests, and labs performed at [facility] " click here if you don't have a [place] MyChart account or go to [email or web address]  Consent: (Patient) Elizabeth Horton provided verbal consent for this virtual visit at the beginning of the encounter.  Current Medications:  Current Outpatient Medications:    cephALEXin  (KEFLEX ) 500 MG capsule, Take 1 capsule (500 mg total) by mouth 2 (two) times daily., Disp: 14 capsule, Rfl: 0   clotrimazole  (LOTRIMIN ) 1 % cream, Apply 1 Application topically 2 (two) times daily., Disp: 30 g, Rfl: 0   fluconazole  (DIFLUCAN ) 150 MG tablet, Take 1 tablet (150 mg total) by mouth every 3 (three) days as needed., Disp: 2 tablet, Rfl: 0   Medications ordered in this encounter:  No orders of the defined types were placed in this encounter.    *If you need refills on other medications prior to your next appointment, please contact your pharmacy*  Follow-Up: Call back or seek an in-person evaluation if the symptoms worsen or if the condition fails to improve as anticipated.  [facility] [phone]  Other Instructions Please take antibiotic as directed.  Increase fluid intake.  Use Saline nasal spray.  Take a daily multivitamin. Use the prescribed medications as directed.  Place a humidifier in the bedroom.  Please call or return clinic if symptoms are not improving.  Please use the link below to get established with a new primary provider giving your ongoing issue.   Sinusitis Sinusitis is redness, soreness, and swelling (inflammation) of the  paranasal sinuses. Paranasal sinuses are air pockets within the bones of your face (beneath the eyes, the middle of the forehead, or above the eyes). In healthy paranasal sinuses, mucus is able to drain out, and air is able to circulate through them by way of your nose. However, when your paranasal sinuses are inflamed, mucus and air can become trapped. This can allow bacteria and other germs to grow and cause infection. Sinusitis can develop quickly and last only a short time (acute) or continue over a long period (chronic). Sinusitis that lasts for more than 12 weeks is considered chronic.  CAUSES  Causes of sinusitis include: Allergies. Structural abnormalities, such as displacement of the cartilage that separates your nostrils (deviated septum), which can decrease the air flow through your nose and sinuses and affect sinus drainage. Functional abnormalities, such as when the small hairs (cilia) that line your sinuses and help remove mucus do not work properly or are not present. SYMPTOMS  Symptoms of acute and chronic sinusitis are the same. The primary symptoms are pain and pressure around the affected sinuses. Other symptoms include: Upper toothache. Earache. Headache. Bad breath. Decreased sense of smell and taste. A cough, which worsens when you are lying flat. Fatigue. Fever. Thick drainage from your nose, which often is green and may contain pus (purulent). Swelling and warmth over the affected sinuses. DIAGNOSIS  Your caregiver will perform a physical exam. During the exam, your caregiver may: Look in your nose for signs of abnormal growths in your nostrils (nasal polyps). Tap over the affected sinus to check for signs of  infection. View the inside of your sinuses (endoscopy) with a special imaging device with a light attached (endoscope), which is inserted into your sinuses. If your caregiver suspects that you have chronic sinusitis, one or more of the following tests may be  recommended: Allergy tests. Nasal culture A sample of mucus is taken from your nose and sent to a lab and screened for bacteria. Nasal cytology A sample of mucus is taken from your nose and examined by your caregiver to determine if your sinusitis is related to an allergy. TREATMENT  Most cases of acute sinusitis are related to a viral infection and will resolve on their own within 10 days. Sometimes medicines are prescribed to help relieve symptoms (pain medicine, decongestants, nasal steroid sprays, or saline sprays).  However, for sinusitis related to a bacterial infection, your caregiver will prescribe antibiotic medicines. These are medicines that will help kill the bacteria causing the infection.  Rarely, sinusitis is caused by a fungal infection. In theses cases, your caregiver will prescribe antifungal medicine. For some cases of chronic sinusitis, surgery is needed. Generally, these are cases in which sinusitis recurs more than 3 times per year, despite other treatments. HOME CARE INSTRUCTIONS  Drink plenty of water. Water helps thin the mucus so your sinuses can drain more easily. Use a humidifier. Inhale steam 3 to 4 times a day (for example, sit in the bathroom with the shower running). Apply a warm, moist washcloth to your face 3 to 4 times a day, or as directed by your caregiver. Use saline nasal sprays to help moisten and clean your sinuses. Take over-the-counter or prescription medicines for pain, discomfort, or fever only as directed by your caregiver. SEEK IMMEDIATE MEDICAL CARE IF: You have increasing pain or severe headaches. You have nausea, vomiting, or drowsiness. You have swelling around your face. You have vision problems. You have a stiff neck. You have difficulty breathing. MAKE SURE YOU:  Understand these instructions. Will watch your condition. Will get help right away if you are not doing well or get worse. Document Released: 07/07/2005 Document Revised:  09/29/2011 Document Reviewed: 07/22/2011 [facility] Patient Information 2014 [place], [place].    If you have been instructed to have an in-person evaluation today at a local Urgent Care facility, please use the link below. It will take you to a list of all of our available [place], including address, phone number and hours of operation. Please do not delay care.  [facility]  If you or a family member do not have a primary care provider, use the link below to schedule a visit and establish care. When you choose a [place] primary care physician or advanced practice provider, you gain a long-term partner in health. Find a Primary Care Provider  Learn more about [place]'s in-office and virtual care options: [place] - Get Care Now

## 2023-12-23 ENCOUNTER — Telehealth: Admitting: Physician Assistant

## 2023-12-23 DIAGNOSIS — J322 Chronic ethmoidal sinusitis: Secondary | ICD-10-CM | POA: Diagnosis not present

## 2023-12-23 MED ORDER — FLUTICASONE PROPIONATE 50 MCG/ACT NA SUSP
2.0000 | Freq: Every day | NASAL | 0 refills | Status: AC
Start: 2023-12-23 — End: ?

## 2023-12-23 NOTE — Patient Instructions (Signed)
 Elizabeth Horton, thank you for joining Angelia Kelp, PA-C for today's virtual visit.  While this provider is not your primary care provider (PCP), if your PCP is located in our provider database this encounter information will be shared with them immediately following your visit.   A [place] MyChart account gives you access to today's visit and all your visits, tests, and labs performed at [facility] " click here if you don't have a [place] MyChart account or go to [email or web address]  Consent: (Patient) Elizabeth Horton provided verbal consent for this virtual visit at the beginning of the encounter.  Current Medications:  Current Outpatient Medications:    fluticasone  (FLONASE ) 50 MCG/ACT nasal spray, Place 2 sprays into both nostrils daily., Disp: 16 g, Rfl: 0   loratadine  (CLARITIN ) 10 MG tablet, Take 1 tablet (10 mg total) by mouth daily., Disp: 30 tablet, Rfl: 11   Medications ordered in this encounter:  Meds ordered this encounter  Medications   fluticasone  (FLONASE ) 50 MCG/ACT nasal spray    Sig: Place 2 sprays into both nostrils daily.    Dispense:  16 g    Refill:  0    Supervising Provider:   Corine Dice [[id number]]     *If you need refills on other medications prior to your next appointment, please contact your pharmacy*  Follow-Up: Call back or seek an in-person evaluation if the symptoms worsen or if the condition fails to improve as anticipated.  [facility] [phone]  Other Instructions  Sinus Pain  Sinus pain may occur when your sinuses become clogged or swollen. Sinuses are air-filled spaces in your skull that are behind the bones of your face and forehead. Sinus pain can range from mild to severe. What are the causes? Sinus pain can result from various conditions that affect the sinuses. Common causes include: Colds. Sinus infections. Allergies. What are the signs or symptoms? The main symptom  of this condition is pain or pressure in your face, forehead, ears, or upper teeth. People who have sinus pain often have other symptoms, such as: Congested or runny nose. Fever. Inability to smell. Headache. Weather changes can make symptoms worse. How is this diagnosed? Your health care provider will diagnose this condition based on your symptoms and a physical exam. If you have pain that keeps coming back or does not go away, your health care provider may recommend more testing. This may include: Imaging tests, such as a CT scan or MRI, to check for problems with your sinuses. Examination of your sinuses using a thin tool with a camera that is inserted through your nose (endoscopy). How is this treated? Treatment for this condition depends on the cause. Sinus pain that is caused by a sinus infection may be treated with antibiotic medicine. Sinus pain that is caused by congestion may be helped by rinsing out (flushing) the nose and sinuses with saline solution. Sinus pain that is caused by allergies may be helped by allergy medicines (antihistamines) and medicated nasal sprays. Sinus surgery may be needed in some cases if other treatments do not help. Follow these instructions at home: General instructions If directed: Apply a warm, moist washcloth to your face to help relieve pain. Use a nasal saline wash. Follow the directions on the bottle or box. Hydrate and humidify Drink enough water to keep your urine clear or pale yellow. Staying hydrated will help to thin your mucus. Use a humidifier if your home is dry. Inhale  steam for 10-15 minutes, 3-4 times a day or as told by your health care provider. You can do this in the bathroom while a hot shower is running. Limit your exposure to cool or dry air. Medicines  Take over-the-counter and prescription medicines only as told by your health care provider. If you were prescribed an antibiotic medicine, take it as told by your health care  provider. Do not stop taking the antibiotic even if you start to feel better. If you have congestion, use a nasal spray to help lessen pressure. Contact a health care provider if: You have sinus pain more than one time a week. You have sensitivity to light or sound. You develop a fever. You feel nauseous or you vomit. Your sinus pain or headache does not get better with treatment. Get help right away if: You have vision problems. You have sudden, severe pain in your face or head. You have a seizure. You are confused. You have a stiff neck. Summary Sinus pain occurs when your sinuses become clogged or swollen. Sinus pain can result from various conditions that affect the sinuses, such as a cold, a sinus infection, or an allergy. Treatment for this condition depends on the cause. It may include medicine, such as antibiotics or antihistamines. This information is not intended to replace advice given to you by your health care provider. Make sure you discuss any questions you have with your health care provider. Document Revised: 06/09/2021 Document Reviewed: 06/09/2021 Elsevier Patient Education  2024 Elsevier Inc.   If you have been instructed to have an in-person evaluation today at a local Urgent Care facility, please use the link below. It will take you to a list of all of our available [place] [facility], including address, phone number and hours of operation. Please do not delay care.  [facility]  If you or a family member do not have a primary care provider, use the link below to schedule a visit and establish care. When you choose a [place] primary care physician or advanced practice provider, you gain a long-term partner in health. Find a Primary Care Provider  Learn more about [place]'s in-office and virtual care options: [place] - Get Care Now

## 2023-12-23 NOTE — Progress Notes (Signed)
 Virtual Visit Consent   Elizabeth Horton, you are scheduled for a virtual visit with a [facility] provider today. Just as with appointments in the office, your consent must be obtained to participate. Your consent will be active for this visit and any virtual visit you may have with one of our providers in the next 365 days. If you have a MyChart account, a copy of this consent can be sent to you electronically.  As this is a virtual visit, video technology does not allow for your provider to perform a traditional examination. This may limit your provider's ability to fully assess your condition. If your provider identifies any concerns that need to be evaluated in person or the need to arrange testing (such as labs, EKG, etc.), we will make arrangements to do so. Although advances in technology are sophisticated, we cannot ensure that it will always work on either your end or our end. If the connection with a video visit is poor, the visit may have to be switched to a telephone visit. With either a video or telephone visit, we are not always able to ensure that we have a secure connection.  By engaging in this virtual visit, you consent to the provision of healthcare and authorize for your insurance to be billed (if applicable) for the services provided during this visit. Depending on your insurance coverage, you may receive a charge related to this service.  I need to obtain your verbal consent now. Are you willing to proceed with your visit today? Elizabeth Horton has provided verbal consent on 12/23/2023 for a virtual visit (video or telephone). Angelia Kelp, PA-C  Date: 12/23/2023 2:00 PM   Virtual Visit via Video Note   I, Angelia Kelp, connected with  Elizabeth Horton  ([id number], Apr 29, 1986) on 12/23/23 at  2:00 PM EDT by a video-enabled telemedicine application and verified that I am speaking with the correct person using two identifiers.  Location: Patient: Virtual  Visit Location Patient: Home Provider: Virtual Visit Location Provider: Home Office   I discussed the limitations of evaluation and management by telemedicine and the availability of in person appointments. The patient expressed understanding and agreed to proceed.    History of Present Illness: Elizabeth Horton is a 38 y.o. who identifies as a female who was assigned female at birth, and is being seen today for sinus congestion.  HPI: Sinusitis This is a recurrent problem. The current episode started 1 to 4 weeks ago (Seen Virtually on 12/01/23 for similar symptoms and prescribed Amoxil  875mg  BID x 10 days, flonase  and loratadine , did not start or take loratadine  or flonase ; Reports had only minimal improvement). The problem has been gradually worsening since onset. There has been no fever. The pain is moderate. Associated symptoms include congestion, headaches and sinus pressure. Pertinent negatives include no chills, coughing, ear pain, hoarse voice, shortness of breath, sneezing or sore throat. Past treatments include antibiotics (amoxil  875mg ). The treatment provided no relief.  Chronic sinus issues have been consistent since having Covid 19.  She is breastfeeding.   Problems:  Patient Active Problem List   Diagnosis Date Noted   Sepsis (HCC) 03/31/2018   PNA (pneumonia) 03/31/2018   Renal lesion/Infected Rt Kidney cyst 03/31/2018   Breast feeding status of mother 03/31/2018   AKI (acute kidney injury) (HCC) 03/31/2018   S/P cesarean section 07/15/2013   H/O shoulder dystocia in prior pregnancy, currently pregnant 12/20/2012    Allergies: No Known Allergies Medications:  Current Outpatient Medications:    fluticasone  (FLONASE ) 50 MCG/ACT nasal spray, Place 2 sprays into both nostrils daily., Disp: 16 g, Rfl: 0   loratadine  (CLARITIN ) 10 MG tablet, Take 1 tablet (10 mg total) by mouth daily., Disp: 30 tablet, Rfl: 11  Observations/Objective: Patient is well-developed,  well-nourished in no acute distress.  Resting comfortably at home.  Head is normocephalic, atraumatic.  No labored breathing.  Speech is clear and coherent with logical content.  Patient is alert and oriented at baseline.  Pain between eyes and into forehead around the ethmoid sinus area   Assessment and Plan: There are no diagnoses linked to this encounter. - Chronic sinusitis - NO signs of acute infection at this time - Will trial Flonase  for intranasal steroid to help with inflammation, instructed on proper use - Steam and humidifier can help - Work note provided - If symptoms do not improve, advised to seek in person evaluation and consideration for ENT referral  Follow Up Instructions: I discussed the assessment and treatment plan with the patient. The patient was provided an opportunity to ask questions and all were answered. The patient agreed with the plan and demonstrated an understanding of the instructions.  A copy of instructions were sent to the patient via MyChart unless otherwise noted below.    The patient was advised to call back or seek an in-person evaluation if the symptoms worsen or if the condition fails to improve as anticipated.    Angelia Kelp, PA-C

## 2024-01-06 ENCOUNTER — Telehealth: Admitting: Physician Assistant

## 2024-01-06 DIAGNOSIS — J322 Chronic ethmoidal sinusitis: Secondary | ICD-10-CM | POA: Diagnosis not present

## 2024-01-06 DIAGNOSIS — J019 Acute sinusitis, unspecified: Secondary | ICD-10-CM | POA: Diagnosis not present

## 2024-01-06 DIAGNOSIS — B9689 Other specified bacterial agents as the cause of diseases classified elsewhere: Secondary | ICD-10-CM

## 2024-01-06 MED ORDER — AMOXICILLIN 875 MG PO TABS
875.0000 mg | ORAL_TABLET | Freq: Two times a day (BID) | ORAL | 0 refills | Status: AC
Start: 2024-01-06 — End: 2024-01-16

## 2024-01-06 MED ORDER — FLUTICASONE PROPIONATE 50 MCG/ACT NA SUSP
2.0000 | Freq: Every day | NASAL | 0 refills | Status: DC
Start: 2024-01-06 — End: 2024-03-07

## 2024-01-06 MED ORDER — LORATADINE 10 MG PO TABS
10.0000 mg | ORAL_TABLET | Freq: Every day | ORAL | 11 refills | Status: DC
Start: 2024-01-06 — End: 2024-04-28

## 2024-01-06 NOTE — Progress Notes (Signed)
 Virtual Visit Consent   Elizabeth Horton, you are scheduled for a virtual visit with a [facility] provider today. Just as with appointments in the office, your consent must be obtained to participate. Your consent will be active for this visit and any virtual visit you may have with one of our providers in the next 365 days. If you have a MyChart account, a copy of this consent can be sent to you electronically.  As this is a virtual visit, video technology does not allow for your provider to perform a traditional examination. This may limit your provider's ability to fully assess your condition. If your provider identifies any concerns that need to be evaluated in person or the need to arrange testing (such as labs, EKG, etc.), we will make arrangements to do so. Although advances in technology are sophisticated, we cannot ensure that it will always work on either your end or our end. If the connection with a video visit is poor, the visit may have to be switched to a telephone visit. With either a video or telephone visit, we are not always able to ensure that we have a secure connection.  By engaging in this virtual visit, you consent to the provision of healthcare and authorize for your insurance to be billed (if applicable) for the services provided during this visit. Depending on your insurance coverage, you may receive a charge related to this service.  I need to obtain your verbal consent now. Are you willing to proceed with your visit today? [name] has provided verbal consent on 01/06/2024 for a virtual visit (video or telephone). Elizabeth Horton, [place]  Date: 01/06/2024 2:21 PM   Virtual Visit via Video Note   I, Elizabeth Horton, connected with  [name]  ([id number], 16-May-1986) on 01/06/24 at  2:15 PM EDT by a video-enabled telemedicine application and verified that I am speaking with the correct person using two identifiers.  Location: Patient: Virtual  Visit Location Patient: Home Provider: Virtual Visit Location Provider: Home Office   I discussed the limitations of evaluation and management by telemedicine and the availability of in person appointments. The patient expressed understanding and agreed to proceed.    History of Present Illness: Elizabeth Horton is a 38 y.o. who identifies as a female who was assigned female at birth, and is being seen today for some ongoing nasal congestion along with sinus pressure, sinus pain (worse in L frontal, behind eye), nasal congestion. Some occasional ear pressure/stopped up sensation. Denies fever, chills, aches. Notes nasal mucous is green/yellow.   Is breastfeeding. No concerns for pregnancy.   HPI: HPI  Problems:  Patient Active Problem List   Diagnosis Date Noted   Sepsis (HCC) 03/31/2018   PNA (pneumonia) 03/31/2018   Renal lesion/Infected Rt Kidney cyst 03/31/2018   Breast feeding status of mother 03/31/2018   AKI (acute kidney injury) (HCC) 03/31/2018   S/P cesarean section 07/15/2013   H/O shoulder dystocia in prior pregnancy, currently pregnant 12/20/2012    Allergies: No Known Allergies Medications:  Current Outpatient Medications:    amoxicillin  (AMOXIL ) 875 MG tablet, Take 1 tablet (875 mg total) by mouth 2 (two) times daily for 10 days., Disp: 20 tablet, Rfl: 0   fluticasone  (FLONASE ) 50 MCG/ACT nasal spray, Place 2 sprays into both nostrils daily., Disp: 16 g, Rfl: 0   loratadine  (CLARITIN ) 10 MG tablet, Take 1 tablet (10 mg total) by mouth daily., Disp: 30 tablet, Rfl: 11  Observations/Objective:  Patient is well-developed, well-nourished in no acute distress.  Resting comfortably at home.  Head is normocephalic, atraumatic.  No labored breathing. Speech is clear and coherent with logical content.  Patient is alert and oriented at baseline.   Assessment and Plan: 1. Acute bacterial sinusitis  2. Chronic ethmoidal sinusitis (Primary) - fluticasone  (FLONASE ) 50  MCG/ACT nasal spray; Place 2 sprays into both nostrils daily.  Dispense: 16 g; Refill: 0 - loratadine  (CLARITIN ) 10 MG tablet; Take 1 tablet (10 mg total) by mouth daily.  Dispense: 30 tablet; Refill: 11 - amoxicillin  (AMOXIL ) 875 MG tablet; Take 1 tablet (875 mg total) by mouth 2 (two) times daily for 10 days.  Dispense: 20 tablet; Refill: 0  Acute on chronic sinusitis. Out of allergy medication. Will restart Flonase  and Loratadine . For acute sinusitis will add on Amoxicillin  as she is breastfeeding and this is a safer option. ENT follow-up discussed. Work note provided.  Follow Up Instructions: I discussed the assessment and treatment plan with the patient. The patient was provided an opportunity to ask questions and all were answered. The patient agreed with the plan and demonstrated an understanding of the instructions.  A copy of instructions were sent to the patient via MyChart unless otherwise noted below.    The patient was advised to call back or seek an in-person evaluation if the symptoms worsen or if the condition fails to improve as anticipated.    Elizabeth Maillard, PA-C

## 2024-01-06 NOTE — Patient Instructions (Signed)
 [name], thank you for joining [name], PA-C for today's virtual visit.  While this provider is not your primary care provider (PCP), if your PCP is located in our provider database this encounter information will be shared with them immediately following your visit.   A [facility] MyChart account gives you access to today's visit and all your visits, tests, and labs performed at [facility]  click here if you don't have a [facility] MyChart account or go to [email or web address]  Consent: (Patient) Annely Sliva provided verbal consent for this virtual visit at the beginning of the encounter.  Current Medications:  Current Outpatient Medications:    fluticasone  (FLONASE ) 50 MCG/ACT nasal spray, Place 2 sprays into both nostrils daily., Disp: 16 g, Rfl: 0   loratadine  (CLARITIN ) 10 MG tablet, Take 1 tablet (10 mg total) by mouth daily., Disp: 30 tablet, Rfl: 11   Medications ordered in this encounter:  No orders of the defined types were placed in this encounter.    *If you need refills on other medications prior to your next appointment, please contact your pharmacy*  Follow-Up: Call back or seek an in-person evaluation if the symptoms worsen or if the condition fails to improve as anticipated.  [facility] [phone]  Other Instructions Please take antibiotic as directed.  Increase fluid intake.  Use Saline nasal spray.  Take a daily multivitamin. Restart the Loratadine  and FLonase  daily.  Place a humidifier in the bedroom.  Please call or return clinic if symptoms are not improving.  Consider calling [facility] ENT specialists -- I would recommend Dr. Darlin Ehrlich or Lydia Sams.  Sinusitis Sinusitis is redness, soreness, and swelling (inflammation) of the paranasal sinuses. Paranasal sinuses are air pockets within the bones of your face (beneath the eyes, the middle of the forehead, or above the eyes). In healthy paranasal  sinuses, mucus is able to drain out, and air is able to circulate through them by way of your nose. However, when your paranasal sinuses are inflamed, mucus and air can become trapped. This can allow bacteria and other germs to grow and cause infection. Sinusitis can develop quickly and last only a short time (acute) or continue over a long period (chronic). Sinusitis that lasts for more than 12 weeks is considered chronic.  CAUSES  Causes of sinusitis include: Allergies. Structural abnormalities, such as displacement of the cartilage that separates your nostrils (deviated septum), which can decrease the air flow through your nose and sinuses and affect sinus drainage. Functional abnormalities, such as when the small hairs (cilia) that line your sinuses and help remove mucus do not work properly or are not present. SYMPTOMS  Symptoms of acute and chronic sinusitis are the same. The primary symptoms are pain and pressure around the affected sinuses. Other symptoms include: Upper toothache. Earache. Headache. Bad breath. Decreased sense of smell and taste. A cough, which worsens when you are lying flat. Fatigue. Fever. Thick drainage from your nose, which often is green and may contain pus (purulent). Swelling and warmth over the affected sinuses. DIAGNOSIS  Your caregiver will perform a physical exam. During the exam, your caregiver may: Look in your nose for signs of abnormal growths in your nostrils (nasal polyps). Tap over the affected sinus to check for signs of infection. View the inside of your sinuses (endoscopy) with a special imaging device with a light attached (endoscope), which is inserted into your sinuses. If your caregiver suspects that you have chronic sinusitis,  one or more of the following tests may be recommended: Allergy tests. Nasal culture A sample of mucus is taken from your nose and sent to a lab and screened for bacteria. Nasal cytology A sample of mucus is taken  from your nose and examined by your caregiver to determine if your sinusitis is related to an allergy. TREATMENT  Most cases of acute sinusitis are related to a viral infection and will resolve on their own within 10 days. Sometimes medicines are prescribed to help relieve symptoms (pain medicine, decongestants, nasal steroid sprays, or saline sprays).  However, for sinusitis related to a bacterial infection, your caregiver will prescribe antibiotic medicines. These are medicines that will help kill the bacteria causing the infection.  Rarely, sinusitis is caused by a fungal infection. In theses cases, your caregiver will prescribe antifungal medicine. For some cases of chronic sinusitis, surgery is needed. Generally, these are cases in which sinusitis recurs more than 3 times per year, despite other treatments. HOME CARE INSTRUCTIONS  Drink plenty of water. Water helps thin the mucus so your sinuses can drain more easily. Use a humidifier. Inhale steam 3 to 4 times a day (for example, sit in the bathroom with the shower running). Apply a warm, moist washcloth to your face 3 to 4 times a day, or as directed by your caregiver. Use saline nasal sprays to help moisten and clean your sinuses. Take over-the-counter or prescription medicines for pain, discomfort, or fever only as directed by your caregiver. SEEK IMMEDIATE MEDICAL CARE IF: You have increasing pain or severe headaches. You have nausea, vomiting, or drowsiness. You have swelling around your face. You have vision problems. You have a stiff neck. You have difficulty breathing. MAKE SURE YOU:  Understand these instructions. Will watch your condition. Will get help right away if you are not doing well or get worse. Document Released: 07/07/2005 Document Revised: 09/29/2011 Document Reviewed: 07/22/2011 [facility] Patient Information 2014 [place], [place].    If you have been instructed to have an in-person evaluation today at a local  Urgent Care facility, please use the link below. It will take you to a list of all of our available [place] [facility], including address, phone number and hours of operation. Please do not delay care.  [facility]  If you or a family member do not have a primary care provider, use the link below to schedule a visit and establish care. When you choose a [facility] primary care physician or advanced practice provider, you gain a long-term partner in health. Find a Primary Care Provider  Learn more about [place]'s in-office and virtual care options: [facility] - Get Care Now

## 2024-01-27 ENCOUNTER — Telehealth: Admitting: Family

## 2024-01-27 DIAGNOSIS — J0110 Acute frontal sinusitis, unspecified: Secondary | ICD-10-CM

## 2024-01-27 MED ORDER — IBUPROFEN 800 MG PO TABS
800.0000 mg | ORAL_TABLET | Freq: Three times a day (TID) | ORAL | 0 refills | Status: DC | PRN
Start: 2024-01-27 — End: 2024-03-07

## 2024-01-27 MED ORDER — PREDNISONE 20 MG PO TABS: 40.0000 mg | ORAL_TABLET | Freq: Every day | ORAL | 0 refills | Status: AC

## 2024-01-27 NOTE — Patient Instructions (Signed)

## 2024-01-27 NOTE — Progress Notes (Signed)
 Virtual Visit Consent   Elizabeth Horton, you are scheduled for a virtual visit with a [facility] provider today. Just as with appointments in the office, your consent must be obtained to participate. Your consent will be active for this visit and any virtual visit you may have with one of our providers in the next 365 days. If you have a MyChart account, a copy of this consent can be sent to you electronically.  As this is a virtual visit, video technology does not allow for your provider to perform a traditional examination. This may limit your provider's ability to fully assess your condition. If your provider identifies any concerns that need to be evaluated in person or the need to arrange testing (such as labs, EKG, etc.), we will make arrangements to do so. Although advances in technology are sophisticated, we cannot ensure that it will always work on either your end or our end. If the connection with a video visit is poor, the visit may have to be switched to a telephone visit. With either a video or telephone visit, we are not always able to ensure that we have a secure connection.  By engaging in this virtual visit, you consent to the provision of healthcare and authorize for your insurance to be billed (if applicable) for the services provided during this visit. Depending on your insurance coverage, you may receive a charge related to this service.  I need to obtain your verbal consent now. Are you willing to proceed with your visit today? Elizabeth Horton has provided verbal consent on 01/27/2024 for a virtual visit (video or telephone). Elizabeth Learn, FNP  Date: 01/27/2024 6:03 PM   Virtual Visit via Video Note   I, Elizabeth Horton, connected with  Elizabeth Horton  ([id number], 11/12/1985) on 01/27/24 at  6:00 PM EDT by a video-enabled telemedicine application and verified that I am speaking with the correct person using two identifiers.  Location: Patient: Virtual Visit Location  Patient: Other: car Provider: Virtual Visit Location Provider: Home Office   I discussed the limitations of evaluation and management by telemedicine and the availability of in person appointments. The patient expressed understanding and agreed to proceed.    History of Present Illness: Elizabeth Horton is a 38 y.o. who identifies as a female who was assigned female at birth, and is being seen today for sinus congestion that started over a month. She reports she was given a prescription of Amoxicillin  875 mg on 01/06/24. She reports she never picked up this rx. Has been taking motrin  800 mg as needed.   She is taking flonase  and Claritin  most day.   HPI: Sinusitis This is a recurrent problem. The current episode started more than 1 month ago. The problem has been waxing and waning since onset. There has been no fever. Her pain is at a severity of 7/10. The pain is moderate. Associated symptoms include headaches and sinus pressure. Pertinent negatives include no congestion, coughing, ear pain, shortness of breath, sneezing or sore throat. Past treatments include acetaminophen  and nasal decongestants. The treatment provided mild relief.    Problems:  Patient Active Problem List   Diagnosis Date Noted   Sepsis (HCC) 03/31/2018   PNA (pneumonia) 03/31/2018   Renal lesion/Infected Rt Kidney cyst 03/31/2018   Breast feeding status of mother 03/31/2018   AKI (acute kidney injury) (HCC) 03/31/2018   S/P cesarean section 07/15/2013   H/O shoulder dystocia in prior pregnancy, currently pregnant 12/20/2012  Allergies: No Known Allergies Medications:  Current Outpatient Medications:    ibuprofen  (ADVIL ) 800 MG tablet, Take 1 tablet (800 mg total) by mouth every 8 (eight) hours as needed., Disp: 30 tablet, Rfl: 0   predniSONE  (DELTASONE ) 20 MG tablet, Take 2 tablets (40 mg total) by mouth daily with breakfast for 5 days., Disp: 10 tablet, Rfl: 0   fluticasone  (FLONASE ) 50 MCG/ACT nasal spray,  Place 2 sprays into both nostrils daily., Disp: 16 g, Rfl: 0   loratadine  (CLARITIN ) 10 MG tablet, Take 1 tablet (10 mg total) by mouth daily., Disp: 30 tablet, Rfl: 11  Observations/Objective: Patient is well-developed, well-nourished in no acute distress.  Resting comfortably .  Head is normocephalic, atraumatic.  No labored breathing.  Speech is clear and coherent with logical content.  Patient is alert and oriented at baseline.  Mild left upper around her eyebrow  Assessment and Plan: 1. Acute frontal sinusitis, recurrence not specified (Primary) - predniSONE  (DELTASONE ) 20 MG tablet; Take 2 tablets (40 mg total) by mouth daily with breakfast for 5 days.  Dispense: 10 tablet; Refill: 0 - ibuprofen  (ADVIL ) 800 MG tablet; Take 1 tablet (800 mg total) by mouth every 8 (eight) hours as needed.  Dispense: 30 tablet; Refill: 0  - Take meds as prescribed - Use a cool mist humidifier  -Use saline nose sprays frequently -Force fluids -For any cough or congestion  Use plain Mucinex - regular strength or max strength is fine -For fever or aces or pains- take tylenol  or ibuprofen . -Throat lozenges if help -Follow up if symptoms worsen or do not improve. Will need to be seen in person.   Follow Up Instructions: I discussed the assessment and treatment plan with the patient. The patient was provided an opportunity to ask questions and all were answered. The patient agreed with the plan and demonstrated an understanding of the instructions.  A copy of instructions were sent to the patient via MyChart unless otherwise noted below.     The patient was advised to call back or seek an in-person evaluation if the symptoms worsen or if the condition fails to improve as anticipated.    Elizabeth Learn, FNP

## 2024-03-07 ENCOUNTER — Telehealth: Admitting: Physician Assistant

## 2024-03-07 DIAGNOSIS — J321 Chronic frontal sinusitis: Secondary | ICD-10-CM

## 2024-03-07 MED ORDER — MOMETASONE FUROATE 50 MCG/ACT NA SUSP: 2.0000 | Freq: Every day | NASAL | 12 refills | Status: DC

## 2024-03-07 MED ORDER — IBUPROFEN 800 MG PO TABS: 800.0000 mg | ORAL_TABLET | Freq: Three times a day (TID) | ORAL | 0 refills | Status: DC | PRN

## 2024-03-07 NOTE — Progress Notes (Signed)
 Virtual Visit Consent   [name], you are scheduled for a virtual visit with a [facility] provider today. Just as with appointments in the office, your consent must be obtained to participate. Your consent will be active for this visit and any virtual visit you may have with one of our providers in the next 365 days. If you have a MyChart account, a copy of this consent can be sent to you electronically.  As this is a virtual visit, video technology does not allow for your provider to perform a traditional examination. This may limit your provider's ability to fully assess your condition. If your provider identifies any concerns that need to be evaluated in person or the need to arrange testing (such as labs, EKG, etc.), we will make arrangements to do so. Although advances in technology are sophisticated, we cannot ensure that it will always work on either your end or our end. If the connection with a video visit is poor, the visit may have to be switched to a telephone visit. With either a video or telephone visit, we are not always able to ensure that we have a secure connection.  By engaging in this virtual visit, you consent to the provision of healthcare and authorize for your insurance to be billed (if applicable) for the services provided during this visit. Depending on your insurance coverage, you may receive a charge related to this service.  I need to obtain your verbal consent now. Are you willing to proceed with your visit today? Elizabeth Horton has provided verbal consent on 03/07/2024 for a virtual visit (video or telephone). Delon CHRISTELLA Dickinson, PA-C  Date: 03/07/2024 2:21 PM   Virtual Visit via Video Note   I, Delon CHRISTELLA Dickinson, connected with  [name]  ([id number], Oct 27, 1985) on 03/07/24 at  2:00 PM EDT by a video-enabled telemedicine application and verified that I am speaking with the correct person using two identifiers.  Location: Patient: Virtual  Visit Location Patient: Home Provider: Virtual Visit Location Provider: Home Office   I discussed the limitations of evaluation and management by telemedicine and the availability of in person appointments. The patient expressed understanding and agreed to proceed.    History of Present Illness: Elizabeth Horton is a 38 y.o. who identifies as a female who was assigned female at birth, and is being seen today for sinus pain.  HPI: Sinusitis This is a recurrent problem. The current episode started in the past 7 days (3 days ago; Has been a recurrent issue with multiple visits, started after having Covid 19). The problem has been gradually worsening since onset. There has been no fever. The pain is moderate. Associated symptoms include congestion, headaches and sinus pressure (left sided; always on the left). Pertinent negatives include no chills, coughing, ear pain, shortness of breath or sore throat. Treatments tried: ibuprofen , flonase . The treatment provided no relief.    Does have ENT appt on Friday.  Problems:  Patient Active Problem List   Diagnosis Date Noted   Sepsis (HCC) 03/31/2018   PNA (pneumonia) 03/31/2018   Renal lesion/Infected Rt Kidney cyst 03/31/2018   Breast feeding status of mother 03/31/2018   AKI (acute kidney injury) (HCC) 03/31/2018   S/P cesarean section 07/15/2013   H/O shoulder dystocia in prior pregnancy, currently pregnant 12/20/2012    Allergies: No Known Allergies Medications:  Current Outpatient Medications:    ibuprofen  (ADVIL ) 800 MG tablet, Take 1 tablet (800 mg total) by mouth every 8 (  eight) hours as needed., Disp: 30 tablet, Rfl: 0   mometasone  (NASONEX ) 50 MCG/ACT nasal spray, Place 2 sprays into the nose daily., Disp: 1 each, Rfl: 12   loratadine  (CLARITIN ) 10 MG tablet, Take 1 tablet (10 mg total) by mouth daily., Disp: 30 tablet, Rfl: 11  Observations/Objective: Patient is well-developed, well-nourished in no acute distress.  Resting  comfortably at home.  Head is normocephalic, atraumatic.  No labored breathing.  Speech is clear and coherent with logical content.  Patient is alert and oriented at baseline.    Assessment and Plan: 1. Chronic frontal sinusitis (Primary) - ibuprofen  (ADVIL ) 800 MG tablet; Take 1 tablet (800 mg total) by mouth every 8 (eight) hours as needed.  Dispense: 30 tablet; Refill: 0 - mometasone  (NASONEX ) 50 MCG/ACT nasal spray; Place 2 sprays into the nose daily.  Dispense: 1 each; Refill: 12  - Chronic sinusitis, no signs of acute infection at this time - Seems inflammatory - Ibuprofen  800mg  prescribed as she states this is most effective - Will change Fluticasone  (Flonase ) to Mometasone  as Flonase  as not been effective - Continue allergy medications - Saline nasal rinses can help - Steam and humidifier can help - Keep scheduled appt with ENT on Friday - Seek further evaluation if symptoms worsen acutely before seen  Follow Up Instructions: I discussed the assessment and treatment plan with the patient. The patient was provided an opportunity to ask questions and all were answered. The patient agreed with the plan and demonstrated an understanding of the instructions.  A copy of instructions were sent to the patient via MyChart unless otherwise noted below.    The patient was advised to call back or seek an in-person evaluation if the symptoms worsen or if the condition fails to improve as anticipated.    Delon CHRISTELLA Dickinson, PA-C

## 2024-03-07 NOTE — Patient Instructions (Signed)
 Sierra Karlene Room, thank you for joining Delon CHRISTELLA Dickinson, PA-C for today's virtual visit.  While this provider is not your primary care provider (PCP), if your PCP is located in our provider database this encounter information will be shared with them immediately following your visit.   A [facility] MyChart account gives you access to today's visit and all your visits, tests, and labs performed at [facility]  click here if you don't have a [facility] MyChart account or go to [email or web address]  Consent: (Patient) Elizabeth Horton provided verbal consent for this virtual visit at the beginning of the encounter.  Current Medications:  Current Outpatient Medications:    ibuprofen  (ADVIL ) 800 MG tablet, Take 1 tablet (800 mg total) by mouth every 8 (eight) hours as needed., Disp: 30 tablet, Rfl: 0   mometasone  (NASONEX ) 50 MCG/ACT nasal spray, Place 2 sprays into the nose daily., Disp: 1 each, Rfl: 12   loratadine  (CLARITIN ) 10 MG tablet, Take 1 tablet (10 mg total) by mouth daily., Disp: 30 tablet, Rfl: 11   Medications ordered in this encounter:  Meds ordered this encounter  Medications   ibuprofen  (ADVIL ) 800 MG tablet    Sig: Take 1 tablet (800 mg total) by mouth every 8 (eight) hours as needed.    Dispense:  30 tablet    Refill:  0    Supervising Provider:   LAMPTEY, PHILIP O [id number]   mometasone  (NASONEX ) 50 MCG/ACT nasal spray    Sig: Place 2 sprays into the nose daily.    Dispense:  1 each    Refill:  12    Supervising Provider:   BLAISE ALEENE KIDD [phone]     *If you need refills on other medications prior to your next appointment, please contact your pharmacy*  Follow-Up: Call back or seek an in-person evaluation if the symptoms worsen or if the condition fails to improve as anticipated.  [facility] [phone]  Other Instructions Sinus Pain  Sinus pain may occur when your sinuses become clogged or swollen. Sinuses  are air-filled spaces in your skull that are behind the bones of your face and forehead. Sinus pain can range from mild to severe. What are the causes? Sinus pain can result from various conditions that affect the sinuses. Common causes include: Colds. Sinus infections. Allergies. What are the signs or symptoms? The main symptom of this condition is pain or pressure in your face, forehead, ears, or upper teeth. People who have sinus pain often have other symptoms, such as: Congested or runny nose. Fever. Inability to smell. Headache. Weather changes can make symptoms worse. How is this diagnosed? Your health care provider will diagnose this condition based on your symptoms and a physical exam. If you have pain that keeps coming back or does not go away, your health care provider may recommend more testing. This may include: Imaging tests, such as a CT scan or MRI, to check for problems with your sinuses. Examination of your sinuses using a thin tool with a camera that is inserted through your nose (endoscopy). How is this treated? Treatment for this condition depends on the cause. Sinus pain that is caused by a sinus infection may be treated with antibiotic medicine. Sinus pain that is caused by congestion may be helped by rinsing out (flushing) the nose and sinuses with saline solution. Sinus pain that is caused by allergies may be helped by allergy medicines (antihistamines) and medicated nasal sprays. Sinus surgery may be needed  in some cases if other treatments do not help. Follow these instructions at home: General instructions If directed: Apply a warm, moist washcloth to your face to help relieve pain. Use a nasal saline wash. Follow the directions on the bottle or box. Hydrate and humidify Drink enough water to keep your urine clear or pale yellow. Staying hydrated will help to thin your mucus. Use a humidifier if your home is dry. Inhale steam for 10-15 minutes, 3-4 times a  day or as told by your health care provider. You can do this in the bathroom while a hot shower is running. Limit your exposure to cool or dry air. Medicines  Take over-the-counter and prescription medicines only as told by your health care provider. If you were prescribed an antibiotic medicine, take it as told by your health care provider. Do not stop taking the antibiotic even if you start to feel better. If you have congestion, use a nasal spray to help lessen pressure. Contact a health care provider if: You have sinus pain more than one time a week. You have sensitivity to light or sound. You develop a fever. You feel nauseous or you vomit. Your sinus pain or headache does not get better with treatment. Get help right away if: You have vision problems. You have sudden, severe pain in your face or head. You have a seizure. You are confused. You have a stiff neck. Summary Sinus pain occurs when your sinuses become clogged or swollen. Sinus pain can result from various conditions that affect the sinuses, such as a cold, a sinus infection, or an allergy. Treatment for this condition depends on the cause. It may include medicine, such as antibiotics or antihistamines. This information is not intended to replace advice given to you by your health care provider. Make sure you discuss any questions you have with your health care provider. Document Revised: 06/09/2021 Document Reviewed: 06/09/2021 Elsevier Patient Education  2024 [employer]   If you have been instructed to have an in-person evaluation today at a local Urgent Care facility, please use the link below. It will take you to a list of all of our available [facility] [facility], including address, phone number and hours of operation. Please do not delay care.  [facility]  If you or a family member do not have a primary care provider, use the link below to schedule a visit and establish care. When you choose a  [facility] primary care physician or advanced practice provider, you gain a long-term partner in health. Find a Primary Care Provider  Learn more about [facility]'s in-office and virtual care options: [facility] - Get Care Now

## 2024-03-11 DIAGNOSIS — J309 Allergic rhinitis, unspecified: Secondary | ICD-10-CM | POA: Diagnosis not present

## 2024-03-11 DIAGNOSIS — J301 Allergic rhinitis due to pollen: Secondary | ICD-10-CM | POA: Diagnosis not present

## 2024-03-11 DIAGNOSIS — J329 Chronic sinusitis, unspecified: Secondary | ICD-10-CM | POA: Diagnosis not present

## 2024-03-11 DIAGNOSIS — R0981 Nasal congestion: Secondary | ICD-10-CM | POA: Diagnosis not present

## 2024-04-01 ENCOUNTER — Telehealth: Admitting: Physician Assistant

## 2024-04-01 DIAGNOSIS — B379 Candidiasis, unspecified: Secondary | ICD-10-CM | POA: Diagnosis not present

## 2024-04-01 DIAGNOSIS — T3695XA Adverse effect of unspecified systemic antibiotic, initial encounter: Secondary | ICD-10-CM

## 2024-04-01 MED ORDER — FLUCONAZOLE 150 MG PO TABS: 150.0000 mg | ORAL_TABLET | ORAL | 0 refills | Status: DC | PRN

## 2024-04-01 NOTE — Patient Instructions (Signed)
 Sierra Karlene Room, thank you for joining Delon CHRISTELLA Dickinson, PA-C for today's virtual visit.  While this provider is not your primary care provider (PCP), if your PCP is located in our provider database this encounter information will be shared with them immediately following your visit.   A [facility] MyChart account gives you access to today's visit and all your visits, tests, and labs performed at [facility]  click here if you don't have a [facility] MyChart account or go to [email or web address]  Consent: (Patient) Elizabeth Horton provided verbal consent for this virtual visit at the beginning of the encounter.  Current Medications:  Current Outpatient Medications:    fluconazole  (DIFLUCAN ) 150 MG tablet, Take 1 tablet (150 mg total) by mouth every 3 (three) days as needed., Disp: 3 tablet, Rfl: 0   ibuprofen  (ADVIL ) 800 MG tablet, Take 1 tablet (800 mg total) by mouth every 8 (eight) hours as needed., Disp: 30 tablet, Rfl: 0   loratadine  (CLARITIN ) 10 MG tablet, Take 1 tablet (10 mg total) by mouth daily., Disp: 30 tablet, Rfl: 11   mometasone  (NASONEX ) 50 MCG/ACT nasal spray, Place 2 sprays into the nose daily., Disp: 1 each, Rfl: 12   Medications ordered in this encounter:  Meds ordered this encounter  Medications   fluconazole  (DIFLUCAN ) 150 MG tablet    Sig: Take 1 tablet (150 mg total) by mouth every 3 (three) days as needed.    Dispense:  3 tablet    Refill:  0    Supervising Provider:   LAMPTEY, PHILIP O [[id number]]     *If you need refills on other medications prior to your next appointment, please contact your pharmacy*  Follow-Up: Call back or seek an in-person evaluation if the symptoms worsen or if the condition fails to improve as anticipated.   Virtual Care [phone]  Other Instructions Vaginal Probiotics: AZO vaginal probiotic OLLY Happy Hoo-Ha RAW Vaginal Care RenewLife Women's vaginal probiotic RepHresh  Pro-B  Vaginal washes: Honey Pot Summer's Eve Vagisil Feminine cleanser  Boric Acid Suppositories  Healthy vaginal hygiene practices    -  Avoid sleeper pajamas. Nightgowns allow air to circulate.  Sleep without underpants whenever possible.   -  Wear cotton underpants during the day. Double-rinse underwear after washing to avoid residual irritants. Do not use fabric softeners for underwear and swimsuits.   - Avoid tights, leotards, leggings, skinny jeans, and other tight-fitting clothing. Skirts and loose-fitting pants allow air to circulate.   - Avoid pantyliners.  Instead use tampons or cotton pads.   - Use the restroom after intercourse to help prevent UTI's   - Daily warm bathing is helpful:     - Soak in clean water (no soap) for 10 to 15 minutes. Adding vinegar or baking soda to the water has not been specifically studied and may not be better than clean water alone.      - Use soap to wash regions other than the genital area just before getting out of the tub. Limit use of any soap on genital areas. Use fragance-free soaps.     - Rinse the genital area well and gently pat dry.  Don't rub.  Hair dryer to assist with drying can be used only if on cool setting.     - Do not use bubble baths or perfumed soaps.   - Do not use any feminine sprays, douches or powders.  These contain chemicals that will irritate the skin.   - If  the genital area is tender or swollen, cool compresses may relieve the discomfort. Unscented wet wipes can be used instead of toilet paper for wiping.    - Emollients, such as Vaseline, may help protect skin and can be applied to the irritated area.   - Always remember to wipe front-to-back after bowel movements. Pat dry after urination.   - Do not sit in wet swimsuits for long periods of time after swimming    If you have been instructed to have an in-person evaluation today at a local Urgent Care facility, please use the link below. It will take you  to a list of all of our available [facility], including address, phone number and hours of operation. Please do not delay care.  [facility]  If you or a family member do not have a primary care provider, use the link below to schedule a visit and establish care. When you choose a [facility] primary care physician or advanced practice provider, you gain a long-term partner in health. Find a Primary Care Provider  Learn more about [facility]'s in-office and virtual care options: [facility] - Get Care Now

## 2024-04-01 NOTE — Progress Notes (Signed)
 Virtual Visit Consent   Elizabeth Horton, you are scheduled for a virtual visit with a [facility] provider today. Just as with appointments in the office, your consent must be obtained to participate. Your consent will be active for this visit and any virtual visit you may have with one of our providers in the next 365 days. If you have a MyChart account, a copy of this consent can be sent to you electronically.  As this is a virtual visit, video technology does not allow for your provider to perform a traditional examination. This may limit your provider's ability to fully assess your condition. If your provider identifies any concerns that need to be evaluated in person or the need to arrange testing (such as labs, EKG, etc.), we will make arrangements to do so. Although advances in technology are sophisticated, we cannot ensure that it will always work on either your end or our end. If the connection with a video visit is poor, the visit may have to be switched to a telephone visit. With either a video or telephone visit, we are not always able to ensure that we have a secure connection.  By engaging in this virtual visit, you consent to the provision of healthcare and authorize for your insurance to be billed (if applicable) for the services provided during this visit. Depending on your insurance coverage, you may receive a charge related to this service.  I need to obtain your verbal consent now. Are you willing to proceed with your visit today? [name] has provided verbal consent on 04/01/2024 for a virtual visit (video or telephone). Delon CHRISTELLA Dickinson, PA-C  Date: 04/01/2024 7:23 PM   Virtual Visit via Video Note   I, Delon CHRISTELLA Dickinson, connected with  Elizabeth Horton  ([id number], 07-15-1986) on 04/01/24 at  7:15 PM EDT by a video-enabled telemedicine application and verified that I am speaking with the correct person using two identifiers.  Location: Patient: Virtual  Visit Location Patient: Home Provider: Virtual Visit Location Provider: Home Office   I discussed the limitations of evaluation and management by telemedicine and the availability of in person appointments. The patient expressed understanding and agreed to proceed.    History of Present Illness: Elizabeth Horton is a 38 y.o. who identifies as a female who was assigned female at birth, and is being seen today for vaginal irritation and itching following prolonged antibiotics (3 weeks) for a complicated sinus issue.  HPI: Vaginal Discharge The patient's primary symptoms include genital itching and vaginal discharge. This is a new problem. The current episode started 1 to 4 weeks ago. The problem occurs constantly. The problem has been gradually worsening. The patient is experiencing no pain. Pertinent negatives include no abdominal pain. The vaginal discharge was white and thick. There has been no bleeding. She has not been passing clots. She has not been passing tissue. The symptoms are aggravated by tactile pressure.     Problems:  Patient Active Problem List   Diagnosis Date Noted   Sepsis (HCC) 03/31/2018   PNA (pneumonia) 03/31/2018   Renal lesion/Infected Rt Kidney cyst 03/31/2018   Breast feeding status of mother 03/31/2018   AKI (acute kidney injury) (HCC) 03/31/2018   S/P cesarean section 07/15/2013   H/O shoulder dystocia in prior pregnancy, currently pregnant 12/20/2012    Allergies: No Known Allergies Medications:  Current Outpatient Medications:    fluconazole  (DIFLUCAN ) 150 MG tablet, Take 1 tablet (150 mg total) by mouth every 3 (  three) days as needed., Disp: 3 tablet, Rfl: 0   ibuprofen  (ADVIL ) 800 MG tablet, Take 1 tablet (800 mg total) by mouth every 8 (eight) hours as needed., Disp: 30 tablet, Rfl: 0   loratadine  (CLARITIN ) 10 MG tablet, Take 1 tablet (10 mg total) by mouth daily., Disp: 30 tablet, Rfl: 11   mometasone  (NASONEX ) 50 MCG/ACT nasal spray, Place 2 sprays  into the nose daily., Disp: 1 each, Rfl: 12  Observations/Objective: Patient is well-developed, well-nourished in no acute distress.  Resting comfortably at home.  Head is normocephalic, atraumatic.  No labored breathing.  Speech is clear and coherent with logical content.  Patient is alert and oriented at baseline.    Assessment and Plan: 1. Antibiotic-induced yeast infection (Primary) - fluconazole  (DIFLUCAN ) 150 MG tablet; Take 1 tablet (150 mg total) by mouth every 3 (three) days as needed.  Dispense: 3 tablet; Refill: 0  - Symptoms consistent with yeast vaginitis from recent antibiotics - Fluconazole  prescribed - Limit bubble baths, scented lotions/soaps/detergents - Limit tight fitting clothing - Seek on person evaluation if not improving or if symptoms worsen   Follow Up Instructions: I discussed the assessment and treatment plan with the patient. The patient was provided an opportunity to ask questions and all were answered. The patient agreed with the plan and demonstrated an understanding of the instructions.  A copy of instructions were sent to the patient via MyChart unless otherwise noted below.    The patient was advised to call back or seek an in-person evaluation if the symptoms worsen or if the condition fails to improve as anticipated.    Delon CHRISTELLA Dickinson, PA-C

## 2024-04-05 DIAGNOSIS — J329 Chronic sinusitis, unspecified: Secondary | ICD-10-CM | POA: Diagnosis not present

## 2024-04-28 ENCOUNTER — Telehealth: Admitting: Physician Assistant

## 2024-04-28 DIAGNOSIS — R3989 Other symptoms and signs involving the genitourinary system: Secondary | ICD-10-CM | POA: Diagnosis not present

## 2024-04-28 MED ORDER — CEPHALEXIN 500 MG PO CAPS: 500.0000 mg | ORAL_CAPSULE | Freq: Two times a day (BID) | ORAL | 0 refills | Status: AC

## 2024-04-28 NOTE — Patient Instructions (Signed)
 Sierra Karlene Room, thank you for joining Elsie Velma Lunger, PA-C for today's virtual visit.  While this provider is not your primary care provider (PCP), if your PCP is located in our provider database this encounter information will be shared with them immediately following your visit.   A [facility] MyChart account gives you access to today's visit and all your visits, tests, and labs performed at [facility]  click here if you don't have a [facility] MyChart account or go to [email or web address]  Consent: (Patient) Elizabeth Horton provided verbal consent for this virtual visit at the beginning of the encounter.  Current Medications:  Current Outpatient Medications:    fluconazole  (DIFLUCAN ) 150 MG tablet, Take 1 tablet (150 mg total) by mouth every 3 (three) days as needed., Disp: 3 tablet, Rfl: 0   ibuprofen  (ADVIL ) 800 MG tablet, Take 1 tablet (800 mg total) by mouth every 8 (eight) hours as needed., Disp: 30 tablet, Rfl: 0   loratadine  (CLARITIN ) 10 MG tablet, Take 1 tablet (10 mg total) by mouth daily., Disp: 30 tablet, Rfl: 11   mometasone  (NASONEX ) 50 MCG/ACT nasal spray, Place 2 sprays into the nose daily., Disp: 1 each, Rfl: 12   Medications ordered in this encounter:  No orders of the defined types were placed in this encounter.    *If you need refills on other medications prior to your next appointment, please contact your pharmacy*  Follow-Up: Call back or seek an in-person evaluation if the symptoms worsen or if the condition fails to improve as anticipated.  [facility] [phone]  Other Instructions Your symptoms are consistent with a bladder infection, also called acute cystitis. Please take your antibiotic (Keflex  ) as directed until all pills are gone.  Stay very well hydrated.  Consider a daily probiotic (Align, Culturelle, or Activia) to help prevent stomach upset caused by the antibiotic.  Taking a probiotic daily may  also help prevent recurrent UTIs.  Also consider taking AZO (Phenazopyridine) tablets to help decrease pain with urination.  If you note any non-resolving, new, or worsening symptoms despite treatment, please seek an in-person evaluation ASAP.  Urinary Tract Infection A urinary tract infection (UTI) can occur any place along the urinary tract. The tract includes the kidneys, ureters, bladder, and urethra. A type of germ called bacteria often causes a UTI. UTIs are often helped with antibiotic medicine.  HOME CARE  If given, take antibiotics as told by your doctor. Finish them even if you start to feel better. Drink enough fluids to keep your pee (urine) clear or pale yellow. Avoid tea, drinks with caffeine, and bubbly (carbonated) drinks. Pee often. Avoid holding your pee in for a long time. Pee before and after having sex (intercourse). Wipe from front to back after you poop (bowel movement) if you are a woman. Use each tissue only once. GET HELP RIGHT AWAY IF:  You have back pain. You have lower belly (abdominal) pain. You have chills. You feel sick to your stomach (nauseous). You throw up (vomit). Your burning or discomfort with peeing does not go away. You have a fever. Your symptoms are not better in 3 days. MAKE SURE YOU:  Understand these instructions. Will watch your condition. Will get help right away if you are not doing well or get worse. Document Released: 12/24/2007 Document Revised: 03/31/2012 Document Reviewed: 02/05/2012 [facility] Patient Information 2015 [place], [place]. This information is not intended to replace advice given to you by your health care provider.  Make sure you discuss any questions you have with your health care provider.    If you have been instructed to have an in-person evaluation today at a local Urgent Care facility, please use the link below. It will take you to a list of all of our available [facility], including address, phone number  and hours of operation. Please do not delay care.  [facility]  If you or a family member do not have a primary care provider, use the link below to schedule a visit and establish care. When you choose a [facility] primary care physician or advanced practice provider, you gain a long-term partner in health. Find a Primary Care Provider  Learn more about [facility]'s in-office and virtual care options: [facility] - Get Care Now

## 2024-04-28 NOTE — Progress Notes (Signed)
 Virtual Visit Consent   Elizabeth Horton, you are scheduled for a virtual visit with a [facility] provider today. Just as with appointments in the office, your consent must be obtained to participate. Your consent will be active for this visit and any virtual visit you may have with one of our providers in the next 365 days. If you have a MyChart account, a copy of this consent can be sent to you electronically.  As this is a virtual visit, video technology does not allow for your provider to perform a traditional examination. This may limit your provider's ability to fully assess your condition. If your provider identifies any concerns that need to be evaluated in person or the need to arrange testing (such as labs, EKG, etc.), we will make arrangements to do so. Although advances in technology are sophisticated, we cannot ensure that it will always work on either your end or our end. If the connection with a video visit is poor, the visit may have to be switched to a telephone visit. With either a video or telephone visit, we are not always able to ensure that we have a secure connection.  By engaging in this virtual visit, you consent to the provision of healthcare and authorize for your insurance to be billed (if applicable) for the services provided during this visit. Depending on your insurance coverage, you may receive a charge related to this service.  I need to obtain your verbal consent now. Are you willing to proceed with your visit today? Elizabeth Horton has provided verbal consent on 04/28/2024 for a virtual visit (video or telephone). Elizabeth Horton, [place]  Date: 04/28/2024 5:34 PM   Virtual Visit via Video Note   I, Elizabeth Horton, connected with  Dillie Burandt  ([id number], 1986/06/18) on 04/28/24 at  5:30 PM EDT by a video-enabled telemedicine application and verified that I am speaking with the correct person using two identifiers.  Location: Patient: Virtual  Visit Location Patient: Home Provider: Virtual Visit Location Provider: Home Office   I discussed the limitations of evaluation and management by telemedicine and the availability of in person appointments. The patient expressed understanding and agreed to proceed.    History of Present Illness: Elizabeth Horton is a 38 y.o. who identifies as a female who was assigned female at birth, and is being seen today for 1 week of dysuria, urgency and frequency. Denies fever, chills, vomiting. Denies concern for pregnancy. Is currently breastfeeding.  OTC -- cranberry juice  HPI: HPI  Problems:  Patient Active Problem List   Diagnosis Date Noted   Sepsis (HCC) 03/31/2018   PNA (pneumonia) 03/31/2018   Renal lesion/Infected Rt Kidney cyst 03/31/2018   Breast feeding status of mother 03/31/2018   AKI (acute kidney injury) 03/31/2018   S/P cesarean section 07/15/2013   H/O shoulder dystocia in prior pregnancy, currently pregnant 12/20/2012    Allergies: No Known Allergies Medications:  Current Outpatient Medications:    cephALEXin  (KEFLEX ) 500 MG capsule, Take 1 capsule (500 mg total) by mouth 2 (two) times daily for 7 days., Disp: 14 capsule, Rfl: 0   ibuprofen  (ADVIL ) 800 MG tablet, Take 1 tablet (800 mg total) by mouth every 8 (eight) hours as needed., Disp: 30 tablet, Rfl: 0   mometasone  (NASONEX ) 50 MCG/ACT nasal spray, Place 2 sprays into the nose daily., Disp: 1 each, Rfl: 12  Observations/Objective: Patient is well-developed, well-nourished in no acute distress.  Resting comfortably at home.  Head  is normocephalic, atraumatic.  No labored breathing.  Speech is clear and coherent with logical content.  Patient is alert and oriented at baseline.   Assessment and Plan: 1. Suspected UTI (Primary) - cephALEXin  (KEFLEX ) 500 MG capsule; Take 1 capsule (500 mg total) by mouth 2 (two) times daily for 7 days.  Dispense: 14 capsule; Refill: 0  Classic UTI symptoms with absence of alarm  signs or symptoms. Prior history of UTI. Will treat empirically with Keflex  for suspected uncomplicated cystitis. Supportive measures and OTC medications reviewed. Strict in-person evaluation precautions discussed.    Follow Up Instructions: I discussed the assessment and treatment plan with the patient. The patient was provided an opportunity to ask questions and all were answered. The patient agreed with the plan and demonstrated an understanding of the instructions.  A copy of instructions were sent to the patient via MyChart unless otherwise noted below.    The patient was advised to call back or seek an in-person evaluation if the symptoms worsen or if the condition fails to improve as anticipated.    Elizabeth Velma Lunger, PA-C

## 2024-06-03 ENCOUNTER — Telehealth: Admitting: Physician Assistant

## 2024-06-03 DIAGNOSIS — J039 Acute tonsillitis, unspecified: Secondary | ICD-10-CM | POA: Diagnosis not present

## 2024-06-03 MED ORDER — AMOXICILLIN 500 MG PO CAPS: 500.0000 mg | ORAL_CAPSULE | Freq: Two times a day (BID) | ORAL | 0 refills | Status: AC

## 2024-06-03 NOTE — Progress Notes (Signed)
 Virtual Visit Consent   Elizabeth Horton, you are scheduled for a virtual visit with a [facility] provider today. Just as with appointments in the office, your consent must be obtained to participate. Your consent will be active for this visit and any virtual visit you may have with one of our providers in the next 365 days. If you have a MyChart account, a copy of this consent can be sent to you electronically.  As this is a virtual visit, video technology does not allow for your provider to perform a traditional examination. This may limit your provider's ability to fully assess your condition. If your provider identifies any concerns that need to be evaluated in person or the need to arrange testing (such as labs, EKG, etc.), we will make arrangements to do so. Although advances in technology are sophisticated, we cannot ensure that it will always work on either your end or our end. If the connection with a video visit is poor, the visit may have to be switched to a telephone visit. With either a video or telephone visit, we are not always able to ensure that we have a secure connection.  By engaging in this virtual visit, you consent to the provision of healthcare and authorize for your insurance to be billed (if applicable) for the services provided during this visit. Depending on your insurance coverage, you may receive a charge related to this service.  I need to obtain your verbal consent now. Are you willing to proceed with your visit today? Elizabeth Horton has provided verbal consent on 06/03/2024 for a virtual visit (video or telephone). Delon CHRISTELLA Dickinson, PA-C  Date: 06/03/2024 2:27 PM   Virtual Visit via Video Note   I, Delon CHRISTELLA Dickinson, connected with  Elizabeth Horton  ([id number], 1986-01-20) on 06/03/24 at  2:15 PM EST by a video-enabled telemedicine application and verified that I am speaking with the correct person using two identifiers.  Location: Patient:  Virtual Visit Location Patient: Home Provider: Virtual Visit Location Provider: Home Office   I discussed the limitations of evaluation and management by telemedicine and the availability of in person appointments. The patient expressed understanding and agreed to proceed.    History of Present Illness: Elizabeth Horton is a 38 y.o. who identifies as a female who was assigned female at birth, and is being seen today for sore throat.  HPI: Sore Throat  This is a new problem. Episode onset: On May 12, 2024 had dental extraction and sore throat started yesterday. The problem has been unchanged. Maximum temperature: subjective, feels cold. Associated symptoms include headaches and trouble swallowing. Pertinent negatives include no congestion, coughing, ear discharge, ear pain, plugged ear sensation, neck pain, shortness of breath or swollen glands. Associated symptoms comments: Tonsils appear swollen and irritated. She has tried NSAIDs (ibuprofen ) for the symptoms. The treatment provided no relief.     Problems:  Patient Active Problem List   Diagnosis Date Noted   Sepsis (HCC) 03/31/2018   PNA (pneumonia) 03/31/2018   Renal lesion/Infected Rt Kidney cyst 03/31/2018   Breast feeding status of mother 03/31/2018   AKI (acute kidney injury) 03/31/2018   S/P cesarean section 07/15/2013   H/O shoulder dystocia in prior pregnancy, currently pregnant 12/20/2012    Allergies: No Known Allergies Medications:  Current Outpatient Medications:    amoxicillin  (AMOXIL ) 500 MG capsule, Take 1 capsule (500 mg total) by mouth 2 (two) times daily for 10 days., Disp: 20 capsule, Rfl: 0  ibuprofen  (ADVIL ) 800 MG tablet, Take 1 tablet (800 mg total) by mouth every 8 (eight) hours as needed., Disp: 30 tablet, Rfl: 0   mometasone  (NASONEX ) 50 MCG/ACT nasal spray, Place 2 sprays into the nose daily., Disp: 1 each, Rfl: 12  Observations/Objective: Patient is well-developed, well-nourished in no acute distress.   Resting comfortably at home.  Head is normocephalic, atraumatic.  No labored breathing.  Speech is clear and coherent with logical content.  Patient is alert and oriented at baseline.    Assessment and Plan: 1. Acute tonsillitis, unspecified etiology (Primary) - amoxicillin  (AMOXIL ) 500 MG capsule; Take 1 capsule (500 mg total) by mouth 2 (two) times daily for 10 days.  Dispense: 20 capsule; Refill: 0  - Suspect tonsillitis - Amoxicillin  prescribed - Tylenol  and Ibuprofen  alternating every 4 hours - Salt water gargles - Chloraseptic spray - Liquid and soft food diet - Push fluids - New toothbrush in 3 days - Seek in person evaluation if not improving or if symptoms worsen   Follow Up Instructions: I discussed the assessment and treatment plan with the patient. The patient was provided an opportunity to ask questions and all were answered. The patient agreed with the plan and demonstrated an understanding of the instructions.  A copy of instructions were sent to the patient via MyChart unless otherwise noted below.    The patient was advised to call back or seek an in-person evaluation if the symptoms worsen or if the condition fails to improve as anticipated.    Delon CHRISTELLA Dickinson, PA-C

## 2024-06-03 NOTE — Patient Instructions (Signed)
 Sierra Karlene Room, thank you for joining Delon CHRISTELLA Dickinson, PA-C for today's virtual visit.  While this provider is not your primary care provider (PCP), if your PCP is located in our provider database this encounter information will be shared with them immediately following your visit.   A [place] MyChart account gives you access to today's visit and all your visits, tests, and labs performed at [facility]  click here if you don't have a [place] MyChart account or go to [email or web address]  Consent: (Patient) Leyanna Bittman provided verbal consent for this virtual visit at the beginning of the encounter.  Current Medications:  Current Outpatient Medications:    amoxicillin  (AMOXIL ) 500 MG capsule, Take 1 capsule (500 mg total) by mouth 2 (two) times daily for 10 days., Disp: 20 capsule, Rfl: 0   ibuprofen  (ADVIL ) 800 MG tablet, Take 1 tablet (800 mg total) by mouth every 8 (eight) hours as needed., Disp: 30 tablet, Rfl: 0   mometasone  (NASONEX ) 50 MCG/ACT nasal spray, Place 2 sprays into the nose daily., Disp: 1 each, Rfl: 12   Medications ordered in this encounter:  Meds ordered this encounter  Medications   amoxicillin  (AMOXIL ) 500 MG capsule    Sig: Take 1 capsule (500 mg total) by mouth 2 (two) times daily for 10 days.    Dispense:  20 capsule    Refill:  0    Supervising Provider:   BLAISE ALEENE KIDD [[id number]]     *If you need refills on other medications prior to your next appointment, please contact your pharmacy*  Follow-Up: Call back or seek an in-person evaluation if the symptoms worsen or if the condition fails to improve as anticipated.  [facility] [phone]  Other Instructions Tonsillitis  Tonsillitis is an infection of the throat that causes the tonsils to become red, tender, and swollen. Tonsils are tissues in the back of your throat. Each tonsil has crevices (crypts). Tonsils normally work to protect the body  from infection. What are the causes? Sudden (acute) tonsillitis may be caused by a virus or bacteria, including streptococcal bacteria. Long-lasting (chronic) tonsillitis occurs when the crypts of the tonsils become filled with pieces of food and bacteria, which makes it easy for the tonsils to become repeatedly infected. Tonsillitis can be spread from person to person when it is caused by a virus or bacteria. It may be spread by inhaling droplets that are released with coughing or sneezing. You may also come into contact with viruses or bacteria on surfaces, such as cups or utensils. What are the signs or symptoms? Symptoms of this condition include: A sore throat. This may include trouble swallowing. White patches on the tonsils. Swollen tonsils. Fever. Headache. Tiredness. Loss of appetite. Snoring during sleep when you did not snore before. Small, foul-smelling, yellowish-white pieces of material (tonsilloliths) that you occasionally cough up or spit out. These can cause you to have bad breath. How is this diagnosed? This condition is diagnosed with a physical exam. Diagnosis can be confirmed with the results of lab tests, including a throat culture. How is this treated? Treatment for this condition depends on the cause, but usually focuses on treating the symptoms associated with it. Treatment may include: Medicines to relieve pain and manage fever. Steroid medicines to reduce swelling. Antibiotic medicines if the condition is caused by bacteria. If episodes of tonsillitis are severe and frequent, your health care provider may recommend surgery to remove the tonsils (tonsillectomy). Follow these instructions  at home: Medicines Take over-the-counter and prescription medicines only as told by your health care provider. If you were prescribed an antibiotic medicine, take it as told by your health care provider. Do not stop taking the antibiotic even if you start to feel better. Eating  and drinking Drink enough fluid to keep your urine pale yellow. While your throat is sore, eat soft or liquid foods, such as sherbet, soups, or soft, warm cereals, such as oatmeal or hot wheat cereal. Drink warm liquids. Eat frozen ice pops. General instructions Rest as much as possible and get plenty of sleep. Gargle with a mixture of salt and water 3-4 times a day or as needed. To make salt water, completely dissolve -1 tsp (3-6 g) of salt in 1 cup (237 mL) of warm water. Do not swallow the mixture of salt and water. Wash your hands regularly with soap and water for at least 20 seconds. If soap and water are not available, use hand sanitizer. Do not share cups, bottles, or other utensils until your symptoms have gone away. Do not use any products that contain nicotine or tobacco. These products include cigarettes, chewing tobacco, and vaping devices, such as e-cigarettes. If you need help quitting, ask your health care provider. Keep all follow-up visits. This is important. Contact a health care provider if: You notice large, tender lumps in your neck that were not there before. You have a fever that does not go away after 2-3 days. You develop a rash. You cough up a green, yellow-brown, or bloody substance. You cannot swallow liquids or food for 24 hours. Only one of your tonsils is swollen. Get help right away if: You develop any new symptoms, such as vomiting, severe headache, stiff neck, chest pain, trouble breathing, or trouble swallowing. You have severe throat pain along with drooling or voice changes. You have severe pain that is not controlled with medicines. You cannot fully open your mouth. You develop redness, swelling, or severe pain anywhere in your neck. Summary Tonsillitis is an infection of the throat that causes the tonsils to become red, tender, and swollen. The most common symptom is pain in the throat. Tonsillitis is most often caused by a virus or bacteria. Get  help right away if you develop any new symptoms, such as vomiting, severe headache, stiff neck, chest pain, or trouble breathing. This information is not intended to replace advice given to you by your health care provider. Make sure you discuss any questions you have with your health care provider. Document Revised: 11/28/2020 Document Reviewed: 11/29/2020 Elsevier Patient Education  2024 [employer]   If you have been instructed to have an in-person evaluation today at a local Urgent Care facility, please use the link below. It will take you to a list of all of our available [facility], including address, phone number and hours of operation. Please do not delay care.  [facility]  If you or a family member do not have a primary care provider, use the link below to schedule a visit and establish care. When you choose a [place] primary care physician or advanced practice provider, you gain a long-term partner in health. Find a Primary Care Provider  Learn more about [facility]'s in-office and virtual care options: [place] - Get Care Now

## 2024-07-10 ENCOUNTER — Telehealth

## 2024-07-10 ENCOUNTER — Encounter: Payer: Self-pay | Admitting: Physician Assistant

## 2024-07-10 DIAGNOSIS — R52 Pain, unspecified: Secondary | ICD-10-CM | POA: Diagnosis not present

## 2024-07-10 DIAGNOSIS — R6889 Other general symptoms and signs: Secondary | ICD-10-CM

## 2024-07-10 DIAGNOSIS — J00 Acute nasopharyngitis [common cold]: Secondary | ICD-10-CM | POA: Diagnosis not present

## 2024-07-10 MED ORDER — FLUTICASONE PROPIONATE 50 MCG/ACT NA SUSP: 2.0000 | Freq: Every day | NASAL | 0 refills | Status: AC

## 2024-07-10 MED ORDER — PROMETHAZINE-DM 6.25-15 MG/5ML PO SYRP: 5.0000 mL | ORAL_SOLUTION | Freq: Four times a day (QID) | ORAL | 0 refills | Status: AC | PRN

## 2024-07-10 NOTE — Patient Instructions (Signed)
" °  [facility] Room, thank you for joining Elizabeth Velma Lunger, PA-C for today's virtual visit.  While this provider is not your primary [facility] provider (PCP), if your PCP is located in our provider database this encounter information will be shared with them immediately following your visit.   A [facility] account gives you access to today's visit and all your visits, tests, and labs performed at [facility]  click here if you don't have a [facility] account or go to [email or web address]  Consent: (Patient) Elizabeth [facility]orton provided verbal consent for this virtual visit at the beginning of the encounter.  Current Medications:  Current Outpatient Medications:    ibuprofen  (ADVIL ) 800 MG tablet, Take 1 tablet (800 mg total) by mouth every 8 (eight) hours as needed., Disp: 30 tablet, Rfl: 0   mometasone  (NASONEX ) 50 MCG/ACT nasal spray, Place 2 sprays into the nose daily., Disp: 1 each, Rfl: 12   Medications ordered in this encounter:  No orders of the defined types were placed in this encounter.    *If you need refills on other medications prior to your next appointment, please contact your pharmacy*  Follow-Up: Call back or seek an in-person evaluation if the symptoms worsen or if the condition fails to improve as anticipated.  [facility] [phone]  Other Instructions Please take the home COVID/Flu test and message us  back ASAP with results.  For now increase fluids and rest. Alternate Tylenol  and Ibuprofen  as needed. Start a saline nasal rinse. Take prescribed medications as directed.  We will adjust treatment and quarantine based on results.  Talk soon!   If you have been instructed to have an in-person evaluation today at a local [facility] facility, please use the link below. It will take you to a list of all of our available [facility], including address, phone number and hours of operation. Please do  not delay [facility].  [facility]  If you or a family member do not have a primary [facility] provider, use the link below to schedule a visit and establish [facility]. When you choose a [facility] primary [facility] physician or advanced practice provider, you gain a long-term partner in health. Find a Primary [facility] Provider  Learn more about [facility] in-office and virtual [facility] options:  - Get [facility] Now  "

## 2024-07-10 NOTE — Progress Notes (Signed)
 " Virtual Visit Consent   [name], you are scheduled for a virtual visit with a [facility] provider today. Just as with appointments in the office, your consent must be obtained to participate. Your consent will be active for this visit and any virtual visit you may have with one of our providers in the next 365 days. If you have a MyChart account, a copy of this consent can be sent to you electronically.  As this is a virtual visit, video technology does not allow for your provider to perform a traditional examination. This may limit your provider's ability to fully assess your condition. If your provider identifies any concerns that need to be evaluated in person or the need to arrange testing (such as labs, EKG, etc.), we will make arrangements to do so. Although advances in technology are sophisticated, we cannot ensure that it will always work on either your end or our end. If the connection with a video visit is poor, the visit may have to be switched to a telephone visit. With either a video or telephone visit, we are not always able to ensure that we have a secure connection.  By engaging in this virtual visit, you consent to the provision of healthcare and authorize for your insurance to be billed (if applicable) for the services provided during this visit. Depending on your insurance coverage, you may receive a charge related to this service.  I need to obtain your verbal consent now. Are you willing to proceed with your visit today? Elizabeth [facility] has provided verbal consent on 07/10/2024 for a virtual visit (video or telephone). Elizabeth [facility], [place]  Date: 07/10/2024 3:38 PM   Virtual Visit via Video Note   I, Elizabeth [facility], connected with  Elizabeth Horton  ([id number], 10/06/1985) on 07/10/2024 at  3:30 PM EST by a video-enabled telemedicine application and verified that I am speaking with the correct person using two identifiers.  Location: Patient:  Virtual Visit Location Patient: Home Provider: Virtual Visit Location Provider: Home Office   I discussed the limitations of evaluation and management by telemedicine and the availability of in person appointments. The patient expressed understanding and agreed to proceed.    History of Present Illness: Elizabeth H[facility] is a 38 y.o. who identifies as a female who was assigned female at birth, and is being seen today for URI symptoms starting 4.5 days ago. Notes Wed she started with fatigue, headache, nasal congestion, sore throat, cough and chills. Notes fatigue and aches unless she keeps the Ibuprofen  in her system. Fever (Tmax 102).   OTC -- Ibuprofen    HPI: HPI  Problems:  Patient Active Problem List   Diagnosis Date Noted   Sepsis (HCC) 03/31/2018   PNA (pneumonia) 03/31/2018   Renal lesion/Infected Rt Kidney cyst 03/31/2018   Breast feeding status of mother 03/31/2018   AKI (acute kidney injury) 03/31/2018   S/P cesarean section 07/15/2013   H/O shoulder dystocia in prior pregnancy, currently pregnant 12/20/2012    Allergies: Allergies[1] Medications: Current Medications[2]  Observations/Objective: Patient is well-developed, well-nourished in no acute distress.  Resting comfortably  at home.  Head is normocephalic, atraumatic.  No labored breathing.  Speech is clear and coherent with logical content.  Patient is alert and oriented at baseline.   Assessment and Plan: 1. Flu-like symptoms (Primary) - promethazine -dextromethorphan (PROMETHAZINE -DM) 6.25-15 MG/5ML syrup; Take 5 mLs by mouth 4 (four) times daily as needed for cough.  Dispense: 118 mL; Refill: 0 -  fluticasone  (FLONASE ) 50 MCG/ACT nasal spray; Place 2 sprays into both nostrils daily.  Dispense: 16 g; Refill: 0  Will have her take home COVID/Flu Test. Outside of window for flu antiviral but if COVID positive, would be candidate for Paxlovid . Supportive measures and OTC medications reviewed. Flonase  and  promethazine -DM per orders. Will adjust treatment based on COVID test results.   Follow Up Instructions: I discussed the assessment and treatment plan with the patient. The patient was provided an opportunity to ask questions and all were answered. The patient agreed with the plan and demonstrated an understanding of the instructions.  A copy of instructions were sent to the patient via MyChart unless otherwise noted below.   The patient was advised to call back or seek an in-person evaluation if the symptoms worsen or if the condition fails to improve as anticipated.    Elizabeth Velma Lunger, PA-C    [1] No Known Allergies [2]  Current Outpatient Medications:    fluticasone  (FLONASE ) 50 MCG/ACT nasal spray, Place 2 sprays into both nostrils daily., Disp: 16 g, Rfl: 0   promethazine -dextromethorphan (PROMETHAZINE -DM) 6.25-15 MG/5ML syrup, Take 5 mLs by mouth 4 (four) times daily as needed for cough., Disp: 118 mL, Rfl: 0  "
